# Patient Record
Sex: Male | Born: 1970 | Race: White | Hispanic: No | Marital: Married | Smoking: Never smoker
Health system: Southern US, Community
[De-identification: ages and names within clinical notes are randomized; demographics above are authoritative.]

## PROBLEM LIST (undated history)

## (undated) DIAGNOSIS — T82868A Thrombosis of vascular prosthetic devices, implants and grafts, initial encounter: Secondary | ICD-10-CM

## (undated) DIAGNOSIS — A419 Sepsis, unspecified organism: Secondary | ICD-10-CM

## (undated) HISTORY — PX: TRANSURETHRAL RESECTION OF PROSTATE: SHX73

---

## 2021-05-08 ENCOUNTER — Emergency Department (HOSPITAL_COMMUNITY): Payer: BC Managed Care – PPO

## 2021-05-08 ENCOUNTER — Other Ambulatory Visit: Payer: Self-pay

## 2021-05-08 ENCOUNTER — Encounter (HOSPITAL_COMMUNITY): Payer: Self-pay

## 2021-05-08 ENCOUNTER — Observation Stay (HOSPITAL_COMMUNITY)
Admission: EM | Admit: 2021-05-08 | Discharge: 2021-05-10 | Disposition: A | Discharge status: Home / Self Care | Payer: BC Managed Care – PPO | Attending: Internal Medicine | Admitting: Internal Medicine

## 2021-05-08 DIAGNOSIS — Z20822 Contact with and (suspected) exposure to covid-19: Secondary | ICD-10-CM | POA: Diagnosis not present

## 2021-05-08 DIAGNOSIS — I6509 Occlusion and stenosis of unspecified vertebral artery: Secondary | ICD-10-CM | POA: Diagnosis present

## 2021-05-08 DIAGNOSIS — I471 Supraventricular tachycardia, unspecified: Secondary | ICD-10-CM | POA: Diagnosis present

## 2021-05-08 DIAGNOSIS — I6502 Occlusion and stenosis of left vertebral artery: Principal | ICD-10-CM | POA: Insufficient documentation

## 2021-05-08 DIAGNOSIS — I6330 Cerebral infarction due to thrombosis of unspecified cerebral artery: Secondary | ICD-10-CM | POA: Diagnosis present

## 2021-05-08 DIAGNOSIS — I7774 Dissection of vertebral artery: Secondary | ICD-10-CM

## 2021-05-08 DIAGNOSIS — I651 Occlusion and stenosis of basilar artery: Secondary | ICD-10-CM | POA: Diagnosis not present

## 2021-05-08 DIAGNOSIS — R000 Tachycardia, unspecified: Secondary | ICD-10-CM | POA: Diagnosis present

## 2021-05-08 DIAGNOSIS — R42 Dizziness and giddiness: Secondary | ICD-10-CM | POA: Diagnosis present

## 2021-05-08 LAB — CBC WITH DIFFERENTIAL/PLATELET
Abs Immature Granulocytes: 0.06 10*3/uL (ref 0.00–0.07)
Basophils Absolute: 0 10*3/uL (ref 0.0–0.1)
Basophils Relative: 0 %
Eosinophils Absolute: 0 10*3/uL (ref 0.0–0.5)
Eosinophils Relative: 0 %
HCT: 46.6 % (ref 39.0–52.0)
Hemoglobin: 15.6 g/dL (ref 13.0–17.0)
Immature Granulocytes: 1 %
Lymphocytes Relative: 13 %
Lymphs Abs: 1.4 10*3/uL (ref 0.7–4.0)
MCH: 30.1 pg (ref 26.0–34.0)
MCHC: 33.5 g/dL (ref 30.0–36.0)
MCV: 89.8 fL (ref 80.0–100.0)
Monocytes Absolute: 0.6 10*3/uL (ref 0.1–1.0)
Monocytes Relative: 6 %
Neutro Abs: 9.1 10*3/uL — ABNORMAL HIGH (ref 1.7–7.7)
Neutrophils Relative %: 80 %
Platelets: 261 10*3/uL (ref 150–400)
RBC: 5.19 MIL/uL (ref 4.22–5.81)
RDW: 12.9 % (ref 11.5–15.5)
WBC: 11.3 10*3/uL — ABNORMAL HIGH (ref 4.0–10.5)
nRBC: 0 % (ref 0.0–0.2)

## 2021-05-08 LAB — COMPREHENSIVE METABOLIC PANEL
ALT: 44 U/L (ref 0–44)
AST: 29 U/L (ref 15–41)
Albumin: 4.4 g/dL (ref 3.5–5.0)
Alkaline Phosphatase: 58 U/L (ref 38–126)
Anion gap: 10 (ref 5–15)
BUN: 16 mg/dL (ref 6–20)
CO2: 25 mmol/L (ref 22–32)
Calcium: 9.6 mg/dL (ref 8.9–10.3)
Chloride: 102 mmol/L (ref 98–111)
Creatinine, Ser: 1.05 mg/dL (ref 0.61–1.24)
GFR, Estimated: >60 mL/min (ref >60)
Glucose, Bld: 98 mg/dL (ref 70–99)
Potassium: 3.8 mmol/L (ref 3.5–5.1)
Sodium: 137 mmol/L (ref 135–145)
Total Bilirubin: 0.3 mg/dL (ref 0.3–1.2)
Total Protein: 7.6 g/dL (ref 6.5–8.1)

## 2021-05-08 MED ORDER — TETRACAINE HCL 0.5 % OP SOLN
1.0000 [drp] | Freq: Once | OPHTHALMIC | Status: AC
  Administered 2021-05-08: 1 [drp] via OPHTHALMIC
  Filled 2021-05-08: qty 4

## 2021-05-08 NOTE — ED Provider Notes (Signed)
Emergency Medicine Provider Triage Evaluation Note  Jim Roman , a 50 y.o. male  was evaluated in triage.  Pt complains of blurred vision, R ear tinnitus, and dizziness that started yesterday at 11am. 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 Denies other associated neurologic complaints.   Recent TURP with subsequent sepsis and admission at [facility] in [place]. Was on course of levaquin and since he finished has had about 4 episodes of his typical ocular migraines.   Review of Systems  Positive: Blurred vision, dizziness, tinnitus Negative: Numbness/weakness  Physical Exam  BP (!) 160/101   Pulse 84   Temp 98 F (36.7 C) (Oral)   Resp 16   Ht 5' 5"  (1.651 m)   Wt 77.1 kg   SpO2 98%   BMI 28.29 kg/m  Gen:   Awake, no distress   Resp:  Normal effort  MSK:   Moves extremities without difficulty  Other:  No facial droop, clear speech, moving all extremities  Medical Decision Making  Medically screening exam initiated at 1:43 PM.  Appropriate orders placed.  Gibran Veselka was informed that the remainder of the evaluation will be completed by another provider, this initial triage assessment does not replace that evaluation, and the importance of remaining in the ED until their evaluation is complete.     Rodney Booze, PA-C 05/08/21 1351    Lajean Saver, MD 05/09/21 1254

## 2021-05-08 NOTE — ED Triage Notes (Signed)
Pt reports dizziness and blurred vision in both eyes since yesterday around 11 am. Denies headache Pt was recently admitted for sepsis post TURP surgery.

## 2021-05-08 NOTE — ED Notes (Signed)
Pt denies blurred vision and dizziness at the moment. He feels the blurred vision started after the sepsis dx

## 2021-05-08 NOTE — ED Provider Notes (Signed)
[facility] EMERGENCY DEPARTMENT Provider Note   CSN: [id number] Arrival date & time: 05/08/21  1301     History Chief Complaint  Patient presents with   Dizziness   Blurred Vision    Jim Roman is a 50 y.o. male.   Dizziness  50 year old male PMHx ocular migraines, TURP complicated by postoperative sepsis requiring admission at [facility] in [place], s/p Levaquin x2 weeks that he completed 1 or 2 weeks ago, presenting for dizziness and blurred vision.  Patient reports sudden onset yesterday around 63, affecting only his right eye, constant with intermittent worsening, no modifying factors.  Associated symptoms include dizziness and intermittent dull right-sided headache with ringing nonpulsatile tinnitus, as well as intermittent "burning" smell over the last several days.  He notes that he was diagnosed with ocular migraines roughly 15 to 20 years ago by an ophthalmologist, with episodes causing bilateral significant blurring of vision to the extent where he can only differentiate light and dark objects.  He has had 4 such episodes after completing his course of Levaquin.  His present vision changes are different from his typical ocular migraines.  No prior history of seizure, stroke, brain mass.  No further medical concerns time including fevers, chills, diaphoresis, sore throat, rhinorrhea, cough, shortness of breath, chest pain, palpitations, pedal edema, bowel/bladder changes, focal paresthesias/weakness, eye pain, double vision.  History reviewed. No pertinent past medical history.  There are no problems to display for this patient.   Past Surgical History:  Procedure Laterality Date   TRANSURETHRAL RESECTION OF PROSTATE         No family history on file.     Home Medications Prior to Admission medications   Not on File    Allergies    Sulfa antibiotics  Review of Systems   Review of Systems  Neurological:  Positive for dizziness.   All other systems reviewed and are negative.  Physical Exam Updated Vital Signs BP (!) 163/93 (BP Location: Left Arm)   Pulse 84   Temp 98.3 F (36.8 C) (Oral)   Resp 18   Ht 5' 5"  (1.651 m)   Wt 77.1 kg   SpO2 97%   BMI 28.29 kg/m   Physical Exam Vitals and nursing note reviewed.  Constitutional:      General: He is not in acute distress. HENT:     Head: Normocephalic and atraumatic.     Nose: Nose normal.     Mouth/Throat:     Mouth: Mucous membranes are moist.     Pharynx: Oropharynx is clear.  Eyes:     General: No scleral icterus.       Right eye: No discharge.        Left eye: No discharge.     Extraocular Movements: Extraocular movements intact.     Conjunctiva/sclera: Conjunctivae normal.     Pupils: Pupils are equal, round, and reactive to light.     Comments: OD IOP 17  Cardiovascular:     Rate and Rhythm: Normal rate and regular rhythm.     Heart sounds: Murmur heard.    No friction rub. No gallop.  Pulmonary:     Effort: Pulmonary effort is normal.     Breath sounds: No stridor. No wheezing, rhonchi or rales.  Abdominal:     General: There is no distension.     Palpations: Abdomen is soft.     Tenderness: There is no abdominal tenderness. There is no guarding or rebound.  Musculoskeletal:  Cervical back: Normal range of motion. No rigidity.     Right lower leg: No edema.     Left lower leg: No edema.  Skin:    General: Skin is warm and dry.  Neurological:     Mental Status: He is alert and oriented to person, place, and time. Mental status is at baseline.     Comments: Mental status: a&o x4 Speech: clear, no dysarthria CN II: visual fields grossly intact with blurred vision in right eye CN III/IV/VI: PERRL, EOMI CN V: facial sensation to LT and mastication intact CN VII: no facial droop CN VIII: no nystagmus, audition intact to finger rub VN IX/X: swallow intact CN XI: trapezius and SCM motor function intact CN XII: midline tongue w/o  atrophy or fasciculation RUE: 5/5 strength, sensation to LT intact LUE: 5/5 strength, sensation to LT intact RLE: 5/5 strength, sensation to LT intact LLE: 5/5 strength, sensation to LT intact Coordination: finger-to-nose, foot alternation intact. No pronator drift or dysdiadochokinesia Gait: intact without imbalance   Psychiatric:        Mood and Affect: Mood normal.        Behavior: Behavior normal.    ED Results / Procedures / Treatments   Labs (all labs ordered are listed, but only abnormal results are displayed) Labs Reviewed  CBC WITH DIFFERENTIAL/PLATELET - Abnormal; Notable for the following components:      Result Value   WBC 11.3 (*)    Neutro Abs 9.1 (*)    All other components within normal limits  COMPREHENSIVE METABOLIC PANEL  SEDIMENTATION RATE    EKG EKG Interpretation  Date/Time:  Friday May 08 2021 13:41:44 EDT Ventricular Rate:  94 PR Interval:  152 QRS Duration: 74 QT Interval:  314 QTC Calculation: 392 R Axis:   28 Text Interpretation: Normal sinus rhythm Normal ECG No old tracing to compare Confirmed by Malvin Johns [phone]) on 05/08/2021 11:32:19 PM  Radiology CT Head Wo Contrast  Result Date: 05/08/2021 CLINICAL DATA:  Neuro deficit, acute stroke suspected. Dizziness and blurred vision in both eyes. EXAM: CT HEAD WITHOUT CONTRAST TECHNIQUE: Contiguous axial images were obtained from the base of the skull through the vertex without intravenous contrast. COMPARISON:  None. FINDINGS: Brain: No evidence of acute large vascular territory infarction, hemorrhage, hydrocephalus, extra-axial collection or mass lesion/mass effect. Vascular: No hyperdense vessel identified. Skull: No acute fracture. Sinuses/Orbits: Visualized sinuses are clear. No acute orbital findings. Other: No mastoid effusions. IMPRESSION: No evidence of acute intracranial abnormality. Electronically Signed   By: Margaretha Sheffield MD   On: 05/08/2021 16:29    Procedures Procedures    Medications Ordered in ED Medications  tetracaine (PONTOCAINE) 0.5 % ophthalmic solution 1 drop (1 drop Right Eye Given 05/08/21 2245)    ED Course  I have reviewed the triage vital signs and the nursing notes.  Pertinent labs & imaging results that were available during my care of the patient were reviewed by me and considered in my medical decision making (see chart for details).    MDM Rules/Calculators/A&P                         This is a 50 year old male PMHx postoperative sepsis last month s/p Levaquin x2 weeks after TURP at [facility] in [place], presenting for blurred vision from right eye since yesterday morning with associated intermittent right-sided headaches accompanied by ringing pulsatile tinnitus, and intermittent smell of "something burning."  On exam, he is neuro  intact with normal right intraocular pressure.  All studies independently reviewed by myself, d/w the attending physician, factored into my MDM. -NSR 94 bpm, normal axis, normal intervals, no acute ST/T changes; no prior for comparison -CBCd: WBC 11.3 -Unremarkable: CT head, CMP -Pending: CTA head and neck, and MR brain without  Currently uncertain of the etiology of patient's presentation.  Imaging pending to work-up for vascular injury, stroke, brain mass, neurologic lesion.  Reassuring IOP, doubt glaucoma.  No evidence of iritis.  Unlikely temporal arteritis, no significant pain, not typical demographic.  CT head without evidence of significant midline shift or intracranial bleeding.  Mild leukocytosis, will this is likely stress response.  No evidence of electrolyte derangement on metabolic panel.  No evidence of arrhythmia on EKG.  Discussed with neurology who recommended CTA head and neck and MRI brain without.  Advised that patient is cleared from their standpoint for outpatient neurologic follow-up if imaging is negative.  Plan was discussed with the patient who understands and agrees  Patient  is HDS, nontoxic, ambulatory on reevaluation prior to signout.  Course of care and plan discussed with Dr. Randal Buba, to patient care was transferred.  Final Clinical Impression(s) / ED Diagnoses Final diagnoses:  None    Rx / DC Orders ED Discharge Orders     None        Levin Bacon, MD 05/09/21 8250    Malvin Johns, MD 05/09/21 1110

## 2021-05-09 ENCOUNTER — Encounter (HOSPITAL_COMMUNITY): Payer: Self-pay | Admitting: Emergency Medicine

## 2021-05-09 ENCOUNTER — Observation Stay (HOSPITAL_COMMUNITY): Payer: BC Managed Care – PPO

## 2021-05-09 ENCOUNTER — Observation Stay (HOSPITAL_BASED_OUTPATIENT_CLINIC_OR_DEPARTMENT_OTHER): Payer: BC Managed Care – PPO

## 2021-05-09 ENCOUNTER — Emergency Department (HOSPITAL_COMMUNITY): Payer: BC Managed Care – PPO

## 2021-05-09 DIAGNOSIS — I6502 Occlusion and stenosis of left vertebral artery: Secondary | ICD-10-CM | POA: Diagnosis not present

## 2021-05-09 DIAGNOSIS — I6509 Occlusion and stenosis of unspecified vertebral artery: Secondary | ICD-10-CM | POA: Diagnosis present

## 2021-05-09 DIAGNOSIS — R000 Tachycardia, unspecified: Secondary | ICD-10-CM | POA: Diagnosis present

## 2021-05-09 DIAGNOSIS — I7774 Dissection of vertebral artery: Secondary | ICD-10-CM

## 2021-05-09 DIAGNOSIS — I471 Supraventricular tachycardia: Secondary | ICD-10-CM

## 2021-05-09 DIAGNOSIS — I6330 Cerebral infarction due to thrombosis of unspecified cerebral artery: Secondary | ICD-10-CM | POA: Diagnosis present

## 2021-05-09 LAB — URINALYSIS, ROUTINE W REFLEX MICROSCOPIC
Bacteria, UA: NONE SEEN
Bilirubin Urine: NEGATIVE
Glucose, UA: NEGATIVE mg/dL
Ketones, ur: NEGATIVE mg/dL
Nitrite: NEGATIVE
Protein, ur: NEGATIVE mg/dL
Specific Gravity, Urine: 1.043 — ABNORMAL HIGH (ref 1.005–1.030)
pH: 6 (ref 5.0–8.0)

## 2021-05-09 LAB — BASIC METABOLIC PANEL
Anion gap: 8 (ref 5–15)
BUN: 10 mg/dL (ref 6–20)
CO2: 25 mmol/L (ref 22–32)
Calcium: 9.3 mg/dL (ref 8.9–10.3)
Chloride: 103 mmol/L (ref 98–111)
Creatinine, Ser: 1.09 mg/dL (ref 0.61–1.24)
GFR, Estimated: >60 mL/min (ref >60)
Glucose, Bld: 114 mg/dL — ABNORMAL HIGH (ref 70–99)
Potassium: 4.1 mmol/L (ref 3.5–5.1)
Sodium: 136 mmol/L (ref 135–145)

## 2021-05-09 LAB — CBC
HCT: 44.9 % (ref 39.0–52.0)
Hemoglobin: 15.2 g/dL (ref 13.0–17.0)
MCH: 30.1 pg (ref 26.0–34.0)
MCHC: 33.9 g/dL (ref 30.0–36.0)
MCV: 88.9 fL (ref 80.0–100.0)
Platelets: 234 10*3/uL (ref 150–400)
RBC: 5.05 MIL/uL (ref 4.22–5.81)
RDW: 13.1 % (ref 11.5–15.5)
WBC: 7 10*3/uL (ref 4.0–10.5)
nRBC: 0 % (ref 0.0–0.2)

## 2021-05-09 LAB — ECHOCARDIOGRAM COMPLETE
Area-P 1/2: 4.1 cm2
Height: 65 in
S' Lateral: 2.7 cm
Weight: 2720 oz

## 2021-05-09 LAB — RESP PANEL BY RT-PCR (FLU A&B, COVID) ARPGX2
Influenza A by PCR: NEGATIVE
Influenza B by PCR: NEGATIVE
SARS Coronavirus 2 by RT PCR: NEGATIVE

## 2021-05-09 LAB — HEPARIN LEVEL (UNFRACTIONATED)
Heparin Unfractionated: 0.18 IU/mL — ABNORMAL LOW (ref 0.30–0.70)
Heparin Unfractionated: 0.33 IU/mL (ref 0.30–0.70)

## 2021-05-09 LAB — HEMOGLOBIN A1C
Hgb A1c MFr Bld: 5.8 % — ABNORMAL HIGH (ref 4.8–5.6)
Mean Plasma Glucose: 119.76 mg/dL

## 2021-05-09 LAB — HIV ANTIBODY (ROUTINE TESTING W REFLEX): HIV Screen 4th Generation wRfx: NONREACTIVE

## 2021-05-09 LAB — SEDIMENTATION RATE: Sed Rate: 14 mm/hr (ref 0–16)

## 2021-05-09 MED ORDER — LACTATED RINGERS IV SOLN: INTRAVENOUS | Status: DC

## 2021-05-09 MED ORDER — LACTATED RINGERS IV BOLUS
1000.0000 mL | Freq: Once | INTRAVENOUS | Status: AC
  Administered 2021-05-09: 1000 mL via INTRAVENOUS

## 2021-05-09 MED ORDER — SODIUM CHLORIDE 0.9% FLUSH: 3.0000 mL | Freq: Two times a day (BID) | INTRAVENOUS | Status: DC

## 2021-05-09 MED ORDER — HEPARIN (PORCINE) 25000 UT/250ML-% IV SOLN
1100.0000 [IU]/h | INTRAVENOUS | Status: DC
  Administered 2021-05-09: 1000 [IU]/h via INTRAVENOUS
  Administered 2021-05-10: 1100 [IU]/h via INTRAVENOUS
  Filled 2021-05-09 (×2): qty 250

## 2021-05-09 MED ORDER — ACETAMINOPHEN 650 MG RE SUPP: 650.0000 mg | Freq: Four times a day (QID) | RECTAL | Status: DC | PRN

## 2021-05-09 MED ORDER — METOPROLOL TARTRATE 5 MG/5ML IV SOLN: 5.0000 mg | Freq: Four times a day (QID) | INTRAVENOUS | Status: DC | PRN

## 2021-05-09 MED ORDER — GADOBUTROL 1 MMOL/ML IV SOLN
7.5000 mL | Freq: Once | INTRAVENOUS | Status: AC | PRN
  Administered 2021-05-09: 7.5 mL via INTRAVENOUS

## 2021-05-09 MED ORDER — SODIUM CHLORIDE 0.9 % IV SOLN: 250.0000 mL | INTRAVENOUS | Status: DC | PRN

## 2021-05-09 MED ORDER — ACETAMINOPHEN 325 MG PO TABS
650.0000 mg | ORAL_TABLET | Freq: Four times a day (QID) | ORAL | Status: DC | PRN
  Administered 2021-05-10 (×2): 650 mg via ORAL
  Filled 2021-05-09 (×2): qty 2

## 2021-05-09 MED ORDER — SODIUM CHLORIDE 0.9% FLUSH: 3.0000 mL | INTRAVENOUS | Status: DC | PRN

## 2021-05-09 MED ORDER — IOHEXOL 350 MG/ML SOLN
75.0000 mL | Freq: Once | INTRAVENOUS | Status: AC | PRN
  Administered 2021-05-09: 75 mL via INTRAVENOUS

## 2021-05-09 NOTE — H&P (Signed)
History and Physical    Jim Roman WSF:[id number] DOB: [birth year] DOA: 05/08/2021  PCP: Pcp, No   Patient coming from: Home  Chief Complaint:  Dizziness and visual changes.  HPI: Jim Roman is a 50 y.o. male with medical history significant for BPH, migraines who had a TURP recently that was complicated by postoperative sepsis at [facility] in [facility].  He was treated with antibiotics and completed the course 2 weeks ago.  He is visiting family here in town and for the last 2 days he has had intermittent episodes of dizziness and blurred vision.  He reports that he sometimes has sensation of seeing lights in his visual field and it looks like he is looking at things through water.  He is also been having mild intermittent headaches and occasional ringing in his right ear.  He has not had any injury or trauma to his head.  He reports he occasionally gets pain in the left side of his neck when he is sleeping for the last week but it resolves if he goes and sits in a recliner and sleeps for a little while.  He denies having any chest pain, palpitations, shortness of breath, cough, abdominal pain, dysuria.  Denies fever or chills.  He has not had any chiropractic manipulation of his neck or back.   ED Course: Patient been hemodynamically stable in the emergency room.  His blood pressure has been elevated but he reports he has whitecoat hypertension and when he is at home his blood pressure is normally in the 120-130/75-80 range.  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  Patient been seen by neurology and Dr. Rory Percy recommends further work-up with echocardiogram and MRA of head and neck.  Lab work in the emergency room was unremarkable.  Hospitalist service was asked to serve patient overnight  Review of Systems:  General: Reports dizziness. Denies fever, chills, weight loss, night sweats.  Denies change  in appetite HENT: Reports headaches. Denies head trauma, denies change in hearing, tinnitus. Denies nasal congestion. Denies sore throat, sores in mouth. Denies difficulty swallowing Eyes: Denies blurry vision, pain in eye, drainage.  Denies discoloration of eyes. Neck: Denies pain.  Denies swelling.  Denies pain with movement. Cardiovascular: Denies chest pain, palpitations. Denies edema. Denies orthopnea Respiratory: Denies shortness of breath, cough. Denies wheezing. Denies sputum production Gastrointestinal: Denies abdominal pain, swelling. Denies nausea, vomiting, diarrhea. Denies melena. Denies hematemesis. Musculoskeletal: Denies limitation of movement.  Denies deformity or swelling.  Denies pain.  Denies arthralgias or myalgias. Genitourinary: Denies pelvic pain.  Denies urinary frequency or hesitancy. Denies dysuria.  Skin: Denies rash.  Denies petechiae, purpura, ecchymosis. Neurological: Denies syncope.  Denies seizure activity. Denies weakness or paresthesia. Denies slurred speech, drooping face.  Psychiatric: Denies depression, anxiety. Denies hallucinations.  History reviewed. No pertinent past medical history.  Past Surgical History:  Procedure Laterality Date   TRANSURETHRAL RESECTION OF PROSTATE      Social History  reports that he has never smoked. He has never used smokeless tobacco. He reports previous alcohol use. He reports that he does not use drugs.  Allergies  Allergen Reactions   Sulfa Antibiotics     Made flu symptoms worse    History reviewed. No pertinent family history.   Prior to Admission medications   Not on File    Physical Exam: Vitals:   05/08/21 1456 05/08/21 1738 05/08/21 2023 05/08/21 2242  BP: (!) 143/94 (!) 141/102 (!) 147/102 (!) 163/93  Pulse: 87 86 78 84  Resp: 16 16 20 18   Temp: 98.2 F (36.8 C) 98.1 F (36.7 C) 98 F (36.7 C) 98.3 F (36.8 C)  TempSrc: Oral Oral Oral Oral  SpO2: 99% 100% 98% 97%  Weight:      Height:         Constitutional: NAD, calm, comfortable Vitals:   05/08/21 1456 05/08/21 1738 05/08/21 2023 05/08/21 2242  BP: (!) 143/94 (!) 141/102 (!) 147/102 (!) 163/93  Pulse: 87 86 78 84  Resp: 16 16 20 18   Temp: 98.2 F (36.8 C) 98.1 F (36.7 C) 98 F (36.7 C) 98.3 F (36.8 C)  TempSrc: Oral Oral Oral Oral  SpO2: 99% 100% 98% 97%  Weight:      Height:       General: WDWN, Alert and oriented x3.  Eyes: EOMI, PERRL, conjunctivae normal.  Sclera nonicteric HENT:  [place]/AT, external ears normal.  Nares patent without epistasis.  Mucous membranes are moist. Posterior pharynx clear of any exudate or lesions. Neck: Soft, normal range of motion, supple, no masses,  Trachea midline Respiratory: clear to auscultation bilaterally, no wheezing, no crackles. Normal respiratory effort. No accessory muscle use.  Cardiovascular: Regular rate and rhythm, no murmurs / rubs / gallops. No extremity edema. 2+ pedal pulses. No carotid bruits.  Abdomen: Soft, no tenderness, nondistended, no rebound or guarding.  No masses palpated. Bowel sounds normoactive Musculoskeletal: FROM. no cyanosis. No joint deformity upper and lower extremities. Normal muscle tone.  Skin: Warm, dry, intact no rashes, lesions, ulcers. No induration Neurologic: CN 2-12 grossly intact.  Normal speech.  Sensation intact, patella DTR +1 bilaterally. Strength 5/5 in all extremities. No pronator drift.  Psychiatric: Normal judgment and insight.  Normal mood.    Labs on Admission: I have personally reviewed following labs and imaging studies  CBC: Recent Labs  Lab 05/08/21 1517  WBC 11.3*  NEUTROABS 9.1*  HGB 15.6  HCT 46.6  MCV 89.8  PLT 357    Basic Metabolic Panel: Recent Labs  Lab 05/08/21 1517  NA 137  K 3.8  CL 102  CO2 25  GLUCOSE 98  BUN 16  CREATININE 1.05  CALCIUM 9.6    GFR: Estimated Creatinine Clearance: 82.4 mL/min (by C-G formula based on SCr of 1.05 mg/dL).  Liver Function Tests: Recent Labs  Lab  05/08/21 1517  AST 29  ALT 44  ALKPHOS 58  BILITOT 0.3  PROT 7.6  ALBUMIN 4.4    Urine analysis: No results found for: COLORURINE, APPEARANCEUR, LABSPEC, [place], GLUCOSEU, HGBUR, BILIRUBINUR, KETONESUR, PROTEINUR, UROBILINOGEN, NITRITE, LEUKOCYTESUR  Radiological Exams on Admission: CT ANGIO HEAD NECK W WO CM  Result Date: 05/09/2021 CLINICAL DATA:  Initial evaluation for neuro deficit, stroke suspected, dizziness. EXAM: CT ANGIOGRAPHY HEAD AND NECK TECHNIQUE: Multidetector CT imaging of the head and neck was performed using the standard protocol during bolus administration of intravenous contrast. Multiplanar CT image reconstructions and MIPs were obtained to evaluate the vascular anatomy. Carotid stenosis measurements (when applicable) are obtained utilizing NASCET criteria, using the distal internal carotid diameter as the denominator. CONTRAST:  61m OMNIPAQUE IOHEXOL 350 MG/ML SOLN COMPARISON:  Prior CT from 05/08/2021 and MRI from earlier the same day. FINDINGS: CTA NECK FINDINGS Aortic arch: Visualized aorta normal in caliber with normal branch pattern. No stenosis about the origin of the great vessels. Right carotid system: Right common and internal carotid arteries widely patent without stenosis, dissection  or occlusion. Left carotid system: Left common and internal carotid arteries widely patent without stenosis, dissection or occlusion. Vertebral arteries: Both vertebral arteries arise from the subclavian arteries. No proximal subclavian artery stenosis. There is a subtle filling defect at the origin of the left vertebral artery (series 7, image 279), nonspecific, but suspicious for possible intraluminal thrombus, possibly related to a small focal dissection. This is also seen on coronal sequence (series 8, image 127). No significant stenosis. Vertebral arteries otherwise widely patent distally within the neck without stenosis or other abnormality. Skeleton: Thoracic dextroscoliosis  partially visualized. No discrete or worrisome osseous lesions. Other neck: No other acute soft tissue abnormality within the neck. No mass or adenopathy. 5 mm right thyroid nodule noted, of doubtful significance given size and patient age, no follow-up imaging recommended (ref: J Am Coll Radiol. 2015 Feb;12(2): 143-50). Upper chest: Visualized upper chest demonstrates no acute finding. Review of the MIP images confirms the above findings CTA HEAD FINDINGS Anterior circulation: Both internal carotid arteries widely patent to the termini without stenosis. A1 segments widely patent. Normal anterior communicating artery complex. Both anterior cerebral arteries widely patent to their distal aspects without stenosis. No M1 stenosis or occlusion. Normal MCA bifurcations. Distal MCA branches well perfused and symmetric. Posterior circulation: Both vertebral arteries patent to the vertebrobasilar junction without stenosis. Left vertebral artery slightly dominant. Extradural origin of the left PICA from the distal left V2 segment noted. Both PICA are patent. There is a subtle additional focal 2 mm filling defect within the basilar artery just distal to the vertebrobasilar junction, best seen on coronal sequence (series 8, image 121). Finding also suspicious for a small focus of thrombus, and could be embolic in nature from the left vertebral artery below. Basilar otherwise widely patent to its distal aspect. Superior cerebral arteries patent bilaterally. Both PCAs primarily supplied via the basilar and are well perfused to their distal aspects. No visible downstream occlusion. Venous sinuses: Patent allowing for timing the contrast bolus. Anatomic variants: None significant. Review of the MIP images confirms the above findings IMPRESSION: 1. Subtle filling defect at the origin of the left vertebral artery, nonspecific, but suspicious for intraluminal thrombus, possibly related to a small focal dissection. No significant  stenosis. 2. Additional subtle 2 mm filling defect within the basilar artery just distal to the vertebrobasilar junction, also suspicious for a small focus of thrombus, suspected to be embolic in nature from the left vertebral artery below. Findings could be confirmed with catheter directed arteriogram as clinically warranted. 3. Otherwise negative CTA of the head and neck. No large vessel occlusion. No other hemodynamically significant or correctable stenosis. Critical Value/emergent results were called by telephone at the time of interpretation on 05/09/2021 at 2:32 am to provider Dr. Randal Buba, who verbally acknowledged these results. Electronically Signed   By: Jeannine Boga M.D.   On: 05/09/2021 02:33   CT Head Wo Contrast  Result Date: 05/08/2021 CLINICAL DATA:  Neuro deficit, acute stroke suspected. Dizziness and blurred vision in both eyes. EXAM: CT HEAD WITHOUT CONTRAST TECHNIQUE: Contiguous axial images were obtained from the base of the skull through the vertex without intravenous contrast. COMPARISON:  None. FINDINGS: Brain: No evidence of acute large vascular territory infarction, hemorrhage, hydrocephalus, extra-axial collection or mass lesion/mass effect. Vascular: No hyperdense vessel identified. Skull: No acute fracture. Sinuses/Orbits: Visualized sinuses are clear. No acute orbital findings. Other: No mastoid effusions. IMPRESSION: No evidence of acute intracranial abnormality. Electronically Signed   By: Margaretha Sheffield MD  On: 05/08/2021 16:29   MR BRAIN WO CONTRAST  Result Date: 05/09/2021 CLINICAL DATA:  Initial evaluation for neuro deficit, stroke suspected. EXAM: MRI HEAD WITHOUT CONTRAST TECHNIQUE: Multiplanar, multiecho pulse sequences of the brain and surrounding structures were obtained without intravenous contrast. COMPARISON:  CT from 05/08/2021. FINDINGS: Brain: Cerebral volume within normal limits for age. Scattered subcentimeter foci of T2/FLAIR hyperintensity noted  involving the periventricular and deep white matter both cerebral hemispheres, nonspecific, but most like related chronic microvascular ischemic disease, mild in nature. No abnormal foci of restricted diffusion to suggest acute or subacute ischemia. Gray-white matter differentiation maintained. No encephalomalacia to suggest chronic cortical infarction. No acute intracranial hemorrhage. Single punctate focus of susceptibility artifact noted at the left medulla (series 7, image 18), nonspecific, possibly a small chronic microhemorrhage, of doubtful significance in isolation. No mass lesion, midline shift or mass effect. No hydrocephalus or extra-axial fluid collection. Pituitary gland suprasellar region within normal limits. Midline structures intact and normal. Vascular: Major intracranial vascular flow voids are maintained. Skull and upper cervical spine: Craniocervical junction within normal limits. Bone marrow signal intensity normal. No scalp soft tissue abnormality. Sinuses/Orbits: Globes orbital soft tissues demonstrate no acute finding. Mild mucosal thickening noted within the left maxillary sinus. Paranasal sinuses are otherwise clear. No mastoid effusion. Inner ear structures grossly normal. Other: None. IMPRESSION: 1. No acute intracranial abnormality. 2. Mild cerebral white matter disease, nonspecific, but most likely related to chronic microvascular ischemic disease. Electronically Signed   By: Jeannine Boga M.D.   On: 05/09/2021 01:22    EKG: Independently reviewed.  EKG is reviewed and shows normal sinus rhythm with no acute ST elevation or depression.  QTc 392  Assessment/Plan Principal Problem:   Vertebral artery thrombosis Jim Roman is placed on medical telemetry floor for observation.  He has been evaluated by Neurology who recommends MRA head and neck, Echocardiogram. Stroke team will follow Placed on heparin for therapeutic anticoagulation and will need to be transitioned to  either Coumadin or DOAC per stroke team recommendation.  Cultures and urine culture will be obtained as patient had recent urosepsis. Neurochecks every 4 hours. Check lipid panel    DVT prophylaxis: Placed on Heparin for therapeutic anticoagulation Code Status:   Full code Family Communication:  Diagnosis plan discussed with patient.  Patient verbalized understanding agrees with plan.  Questions answered.  Further recommendations to follow as clinical indicated Disposition Plan:   Patient is from:  Home  Anticipated DC to:  Home  Anticipated DC date:  anticipate less than 2 midnight stay in the hospital   Admission status:  Observation   Yevonne Aline Shantanique Hodo MD [facility]  How to contact the [facility] Attending or Consulting provider [place] or covering provider during after hours [place], for this patient?   Check the care team in [facility] and look for a) attending/consulting [facility] provider listed and b) the [facility] team listed Log into [email or web address] and use [place]'s universal password to access. If you do not have the password, please contact the hospital operator. Locate the [facility] provider you are looking for under [facility] and page to a number that you can be directly reached. If you still have difficulty reaching the provider, please page the [facility] (Director on Call) for the Hospitalists listed on amion for assistance.  05/09/2021, 4:10 AM

## 2021-05-09 NOTE — Progress Notes (Signed)
  Echocardiogram 2D Echocardiogram has been performed.  Jim Roman 05/09/2021, 11:10 AM

## 2021-05-09 NOTE — Progress Notes (Signed)
[place] OF CARE NOTE Patient: Hung Rhinesmith RWP:[id number]   PCP: Pcp, No DOB: [birth year]   DOA: 05/08/2021   DOS: 05/09/2021    Patient was admitted by my colleague earlier on 05/09/2021. I have reviewed the H&P as well as assessment and plan and agree with the same. Important changes in the plan are listed below.  Plan of care: Principal Problem:   Vertebral artery thrombosis Active Problems:   Cerebral thrombosis with cerebral infarction   SVT (supraventricular tachycardia) (HCC)   Sinus tachycardia  Pt has sinus tachycardia for now. On tele earlier he had dizziness with  What appears to be SVT. Echocardiogram currently performed.  Normal EF, no RV dysfunction. Orthostatic vitals are relatively stable. IV Lopressor as needed.  Will bolus with LR.  Continue IVF fluid after that. Monitor on telemetry.  Continue IV heparin.   Author: Berle Mull, MD [facility] 05/09/2021 2:08 PM   If 7PM-7AM, please contact night-coverage at [email or web address]

## 2021-05-09 NOTE — ED Notes (Signed)
Report given to Jyl Heinz, RN

## 2021-05-09 NOTE — ED Provider Notes (Signed)
[facility] EMERGENCY DEPARTMENT Provider Note   CSN: [id number] Arrival date & time: 05/08/21  1301     History Chief Complaint  Patient presents with   Dizziness   Blurred Vision    Jim Roman is a 50 y.o. male.  The history is provided by the patient.  Illness Location:  Eyes, blurry vision that started at home at 11 am on Thursday while at his computer Quality:  Blurry vision Severity:  Moderate Onset quality:  Sudden Duration:  2 days Timing:  Constant Progression:  Unchanged Chronicity:  New Context:  S/p TURP and the post operative sepsis Relieved by:  Nothing Worsened by:  Nothing Ineffective treatments:  None Associated symptoms: no fever, no rash, no vomiting and no wheezing   Associated symptoms comment:  "Feels "off kilter"  Risk factors:  Post op     History reviewed. No pertinent past medical history.  There are no problems to display for this patient.   Past Surgical History:  Procedure Laterality Date   TRANSURETHRAL RESECTION OF PROSTATE         No family history on file.     Home Medications Prior to Admission medications   Not on File    Allergies    Sulfa antibiotics  Review of Systems   Review of Systems  Constitutional:  Negative for fever.  HENT:  Negative for facial swelling.   Eyes:  Positive for visual disturbance.  Respiratory:  Negative for wheezing.   Cardiovascular:  Negative for leg swelling.  Gastrointestinal:  Negative for vomiting.  Musculoskeletal:  Negative for neck stiffness.  Skin:  Negative for rash.  Neurological:  Positive for dizziness. Negative for facial asymmetry.  Psychiatric/Behavioral:  Negative for agitation.    Physical Exam Updated Vital Signs BP (!) 163/93 (BP Location: Left Arm)   Pulse 84   Temp 98.3 F (36.8 C) (Oral)   Resp 18   Ht 5' 5"  (1.651 m)   Wt 77.1 kg   SpO2 97%   BMI 28.29 kg/m   Physical Exam Vitals and nursing note reviewed.  Constitutional:       General: He is not in acute distress.    Appearance: Normal appearance.  HENT:     Head: Normocephalic and atraumatic.     Nose: Nose normal.  Eyes:     Extraocular Movements: Extraocular movements intact.     Conjunctiva/sclera: Conjunctivae normal.  Cardiovascular:     Rate and Rhythm: Normal rate and regular rhythm.     Pulses: Normal pulses.     Heart sounds: Normal heart sounds.  Pulmonary:     Effort: Pulmonary effort is normal.     Breath sounds: Normal breath sounds.  Abdominal:     General: Abdomen is flat. Bowel sounds are normal.     Palpations: Abdomen is soft.     Tenderness: There is no abdominal tenderness. There is no guarding.  Musculoskeletal:        General: Normal range of motion.     Cervical back: Normal range of motion and neck supple.  Skin:    General: Skin is warm and dry.     Capillary Refill: Capillary refill takes less than 2 seconds.  Neurological:     General: No focal deficit present.     Mental Status: He is alert and oriented to person, place, and time.     Deep Tendon Reflexes: Reflexes normal.  Psychiatric:        Mood and Affect:  Mood normal.        Behavior: Behavior normal.    ED Results / Procedures / Treatments   Labs (all labs ordered are listed, but only abnormal results are displayed) Labs Reviewed  CBC WITH DIFFERENTIAL/PLATELET - Abnormal; Notable for the following components:      Result Value   WBC 11.3 (*)    Neutro Abs 9.1 (*)    All other components within normal limits  RESP PANEL BY RT-PCR (FLU A&B, COVID) [id number]  COMPREHENSIVE METABOLIC PANEL  SEDIMENTATION RATE    EKG EKG Interpretation  Date/Time:  Friday May 08 2021 13:41:44 EDT Ventricular Rate:  94 PR Interval:  152 QRS Duration: 74 QT Interval:  314 QTC Calculation: 392 R Axis:   28 Text Interpretation: Normal sinus rhythm Normal ECG No old tracing to compare Confirmed by Malvin Johns [phone]) on 05/08/2021 11:32:19 PM  Radiology CT ANGIO  HEAD NECK W WO CM  Result Date: 05/09/2021 CLINICAL DATA:  Initial evaluation for neuro deficit, stroke suspected, dizziness. EXAM: CT ANGIOGRAPHY HEAD AND NECK TECHNIQUE: Multidetector CT imaging of the head and neck was performed using the standard protocol during bolus administration of intravenous contrast. Multiplanar CT image reconstructions and MIPs were obtained to evaluate the vascular anatomy. Carotid stenosis measurements (when applicable) are obtained utilizing NASCET criteria, using the distal internal carotid diameter as the denominator. CONTRAST:  16m OMNIPAQUE IOHEXOL 350 MG/ML SOLN COMPARISON:  Prior CT from 05/08/2021 and MRI from earlier the same day. FINDINGS: CTA NECK FINDINGS Aortic arch: Visualized aorta normal in caliber with normal branch pattern. No stenosis about the origin of the great vessels. Right carotid system: Right common and internal carotid arteries widely patent without stenosis, dissection or occlusion. Left carotid system: Left common and internal carotid arteries widely patent without stenosis, dissection or occlusion. Vertebral arteries: Both vertebral arteries arise from the subclavian arteries. No proximal subclavian artery stenosis. There is a subtle filling defect at the origin of the left vertebral artery (series 7, image 279), nonspecific, but suspicious for possible intraluminal thrombus, possibly related to a small focal dissection. This is also seen on coronal sequence (series 8, image 127). No significant stenosis. Vertebral arteries otherwise widely patent distally within the neck without stenosis or other abnormality. Skeleton: Thoracic dextroscoliosis partially visualized. No discrete or worrisome osseous lesions. Other neck: No other acute soft tissue abnormality within the neck. No mass or adenopathy. 5 mm right thyroid nodule noted, of doubtful significance given size and patient age, no follow-up imaging recommended (ref: J Am Coll Radiol. 2015 Feb;12(2):  143-50). Upper chest: Visualized upper chest demonstrates no acute finding. Review of the MIP images confirms the above findings CTA HEAD FINDINGS Anterior circulation: Both internal carotid arteries widely patent to the termini without stenosis. A1 segments widely patent. Normal anterior communicating artery complex. Both anterior cerebral arteries widely patent to their distal aspects without stenosis. No M1 stenosis or occlusion. Normal MCA bifurcations. Distal MCA branches well perfused and symmetric. Posterior circulation: Both vertebral arteries patent to the vertebrobasilar junction without stenosis. Left vertebral artery slightly dominant. Extradural origin of the left PICA from the distal left V2 segment noted. Both PICA are patent. There is a subtle additional focal 2 mm filling defect within the basilar artery just distal to the vertebrobasilar junction, best seen on coronal sequence (series 8, image 121). Finding also suspicious for a small focus of thrombus, and could be embolic in nature from the left vertebral artery below. Basilar otherwise widely patent to its  distal aspect. Superior cerebral arteries patent bilaterally. Both PCAs primarily supplied via the basilar and are well perfused to their distal aspects. No visible downstream occlusion. Venous sinuses: Patent allowing for timing the contrast bolus. Anatomic variants: None significant. Review of the MIP images confirms the above findings IMPRESSION: 1. Subtle filling defect at the origin of the left vertebral artery, nonspecific, but suspicious for intraluminal thrombus, possibly related to a small focal dissection. No significant stenosis. 2. Additional subtle 2 mm filling defect within the basilar artery just distal to the vertebrobasilar junction, also suspicious for a small focus of thrombus, suspected to be embolic in nature from the left vertebral artery below. Findings could be confirmed with catheter directed arteriogram as clinically  warranted. 3. Otherwise negative CTA of the head and neck. No large vessel occlusion. No other hemodynamically significant or correctable stenosis. Critical Value/emergent results were called by telephone at the time of interpretation on 05/09/2021 at 2:32 am to provider Dr. Randal Buba, who verbally acknowledged these results. Electronically Signed   By: Jeannine Boga M.D.   On: 05/09/2021 02:33   CT Head Wo Contrast  Result Date: 05/08/2021 CLINICAL DATA:  Neuro deficit, acute stroke suspected. Dizziness and blurred vision in both eyes. EXAM: CT HEAD WITHOUT CONTRAST TECHNIQUE: Contiguous axial images were obtained from the base of the skull through the vertex without intravenous contrast. COMPARISON:  None. FINDINGS: Brain: No evidence of acute large vascular territory infarction, hemorrhage, hydrocephalus, extra-axial collection or mass lesion/mass effect. Vascular: No hyperdense vessel identified. Skull: No acute fracture. Sinuses/Orbits: Visualized sinuses are clear. No acute orbital findings. Other: No mastoid effusions. IMPRESSION: No evidence of acute intracranial abnormality. Electronically Signed   By: Margaretha Sheffield MD   On: 05/08/2021 16:29   MR BRAIN WO CONTRAST  Result Date: 05/09/2021 CLINICAL DATA:  Initial evaluation for neuro deficit, stroke suspected. EXAM: MRI HEAD WITHOUT CONTRAST TECHNIQUE: Multiplanar, multiecho pulse sequences of the brain and surrounding structures were obtained without intravenous contrast. COMPARISON:  CT from 05/08/2021. FINDINGS: Brain: Cerebral volume within normal limits for age. Scattered subcentimeter foci of T2/FLAIR hyperintensity noted involving the periventricular and deep white matter both cerebral hemispheres, nonspecific, but most like related chronic microvascular ischemic disease, mild in nature. No abnormal foci of restricted diffusion to suggest acute or subacute ischemia. Gray-white matter differentiation maintained. No encephalomalacia to  suggest chronic cortical infarction. No acute intracranial hemorrhage. Single punctate focus of susceptibility artifact noted at the left medulla (series 7, image 18), nonspecific, possibly a small chronic microhemorrhage, of doubtful significance in isolation. No mass lesion, midline shift or mass effect. No hydrocephalus or extra-axial fluid collection. Pituitary gland suprasellar region within normal limits. Midline structures intact and normal. Vascular: Major intracranial vascular flow voids are maintained. Skull and upper cervical spine: Craniocervical junction within normal limits. Bone marrow signal intensity normal. No scalp soft tissue abnormality. Sinuses/Orbits: Globes orbital soft tissues demonstrate no acute finding. Mild mucosal thickening noted within the left maxillary sinus. Paranasal sinuses are otherwise clear. No mastoid effusion. Inner ear structures grossly normal. Other: None. IMPRESSION: 1. No acute intracranial abnormality. 2. Mild cerebral white matter disease, nonspecific, but most likely related to chronic microvascular ischemic disease. Electronically Signed   By: Jeannine Boga M.D.   On: 05/09/2021 01:22    Procedures Procedures   Medications Ordered in ED Medications  heparin ADULT infusion 100 units/mL (25000 units/227m) (has no administration in time range)  tetracaine (PONTOCAINE) 0.5 % ophthalmic solution 1 drop (1 drop Right Eye Given 05/08/21  2245)  iohexol (OMNIPAQUE) 350 MG/ML injection 75 mL (75 mLs Intravenous Contrast Given 05/09/21 0136)    ED Course  I have reviewed the triage vital signs and the nursing notes.  Pertinent labs & imaging results that were available during my care of the patient were reviewed by me and considered in my medical decision making (see chart for details).   MDM Reviewed: nursing note and vitals Interpretation: MRI, CT scan and labs Total time providing critical care: 30-74 minutes (heparin drip). This excludes time  spent performing separately reportable procedures and services. Consults: admitting MD and neurology  CRITICAL CARE Performed by: Bevelyn Arriola K Aahana Elza-Rasch Total critical care time: 30 minutes Critical care time was exclusive of separately billable procedures and treating other patients. Critical care was necessary to treat or prevent imminent or life-threatening deterioration. Critical care was time spent personally by me on the following activities: development of treatment plan with patient and/or surrogate as well as nursing, discussions with consultants, evaluation of patient's response to treatment, examination of patient, obtaining history from patient or surrogate, ordering and performing treatments and interventions, ordering and review of laboratory studies, ordering and review of radiographic studies, pulse oximetry and re-evaluation of patient's condition.   Final Clinical Impression(s) / ED Diagnoses Final diagnoses:  Basilar artery embolism  Vertebral artery dissection ([place])   Admit to medicine  Rx / DC Orders ED Discharge Orders     None        Warren Lindahl, MD 05/09/21 0017

## 2021-05-09 NOTE — Progress Notes (Signed)
ANTICOAGULATION CONSULT NOTE - Initial Consult  Pharmacy Consult for Heparin Indication: Vertebral artery thrombosis/dissection, basilar artery thrombosis  Allergies  Allergen Reactions   Sulfa Antibiotics     Made flu symptoms worse    Patient Measurements: Height: 5' 5"  (165.1 cm) Weight: 77.1 kg (170 lb) IBW/kg (Calculated) : 61.5   Vital Signs: BP: 139/96 (07/30 1334) Pulse Rate: 119 (07/30 1334)  Labs: Recent Labs    05/08/21 1517 05/09/21 0922 05/09/21 1409  HGB 15.6 15.2  --   HCT 46.6 44.9  --   PLT 261 234  --   HEPARINUNFRC  --   --  0.18*  CREATININE 1.05 1.09  --      Estimated Creatinine Clearance: 79.4 mL/min (by C-G formula based on SCr of 1.09 mg/dL).   Medical History: History reviewed. No pertinent past medical history.  Assessment: 50 y/o M with dizziness and blurred vision. CT Angio head/neck with possible vertebral and basilar artery thrombosis and possible vertebral artery dissection. Start heparin per pharmacy. PTA meds reviewed.   CBC remains stable. No bleeding per RN. Heparin level returning at 0.18 - subtherapeutic.   Goal of Therapy:  Heparin level 0.3-0.5 units/ml Monitor platelets by anticoagulation protocol: Yes   Plan:  No bolus and increase heparin drip to 1100 units/hr 8 hour heparin level Daily CBC/heparin level Monitor for bleeding  Lorelei Pont, PharmD, BCPS 05/09/2021 2:49 PM ED Clinical Pharmacist -  [phone]

## 2021-05-09 NOTE — ED Notes (Signed)
Patient transported to CT 

## 2021-05-09 NOTE — Progress Notes (Signed)
ANTICOAGULATION CONSULT NOTE - Initial Consult  Pharmacy Consult for Heparin Indication: Vertebral artery thrombosis/dissection, basilar artery thrombosis  Allergies  Allergen Reactions   Sulfa Antibiotics     Made flu symptoms worse    Patient Measurements: Height: 5' 5"  (165.1 cm) Weight: 77.1 kg (170 lb) IBW/kg (Calculated) : 61.5   Vital Signs: Temp: 98.3 F (36.8 C) (07/29 2242) Temp Source: Oral (07/29 2242) BP: 163/93 (07/29 2242) Pulse Rate: 84 (07/29 2242)  Labs: Recent Labs    05/08/21 1517  HGB 15.6  HCT 46.6  PLT 261  CREATININE 1.05    Estimated Creatinine Clearance: 82.4 mL/min (by C-G formula based on SCr of 1.05 mg/dL).   Medical History: History reviewed. No pertinent past medical history.  Assessment: 50 y/o M with dizziness and blurred vision. CT Angio head/neck with possible vertebral and basilar artery thrombosis and possible vertebral artery dissection. Start heparin per pharmacy. CBC/renal function good. PTA meds reviewed.   Goal of Therapy:  Heparin level 0.3-0.5 units/ml Monitor platelets by anticoagulation protocol: Yes   Plan:  No bolus  Start heparin drip at 1000 units/hr 8 hour heparin level Daily CBC/heparin level Monitor for bleeding  Narda Bonds, PharmD, BCPS Clinical Pharmacist Phone: [phone]

## 2021-05-09 NOTE — ED Notes (Signed)
Attempted to give report x2

## 2021-05-09 NOTE — Consult Note (Signed)
Neurology Consultation  Reason for Consult: Dizziness, blurred vision Referring Physician: Dr. April Palombo  CC: Dizziness, blurred vision  History is obtained from: Patient, chart  HPI: Jim Roman is a 50 y.o. male past medical history of ocular migraines, recent recovery from sepsis secondary to urinary retention and UTI after having had TURP followed by postoperative sepsis in [facility], completed course of antibiotics 2 weeks ago, was visiting family here in town, we had since Thursday 11 AM intermittent dizziness and blurred vision.  He reports that he has this intermittent sensation of seeing lights through water.  He also describes a very mild headache on the right temple as well as some ringing in his right ear which is new. Does not have a prior history of strokes. Denies any neck pain.  Denies any recent head or neck trauma. Denies any current fevers or chills.  Denies shortness of breath or cough.  Denies nausea or vomiting. Is symptoms remain persistent since yesterday and that is what brought him to the hospital. I was called earlier on the phone regarding this patient who symptoms had intermittently resolved and given change in character of his migraine as well as sudden onset ringing in his ears, I did recommend MRI of the brain and CT angiography of the head and neck to rule out stroke versus vascular etiology. CTA of the brain was abnormal.  No stroke on the MRI brain-see detailed discussion below.  Patient denies any chiropractic manipulation or head or neck trauma recently.  He had a chiropractic visit in 2019 for some neck manipulation but nothing recent.   LKW: 11 AM 05/08/2019 tpa given?: no, no stroke on imaging Premorbid modified Rankin scale (mRS): 0  ROS: Full ROS was performed and is negative except as noted in the HPI.   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 Family history: No family history of stroke, no family history of premature prostate issues  Social History:   has no history on file for tobacco use, alcohol use, and drug use.  Medications No current facility-administered medications for this encounter. No current outpatient medications on file.  Exam: Current vital signs: BP (!) 163/93 (BP Location: Left Arm)   Pulse 84   Temp 98.3 F (36.8 C) (Oral)   Resp 18   Ht 5' 5"  (1.651 m)   Wt 77.1 kg   SpO2 97%   BMI 28.29 kg/m  Vital signs in last 24 hours: Temp:  [98 F (36.7 C)-98.3 F (36.8 C)] 98.3 F (36.8 C) (07/29 2242) Pulse Rate:  [78-87] 84 (07/29 2242) Resp:  [16-20] 18 (07/29 2242) BP: (141-163)/(93-102) 163/93 (07/29 2242) SpO2:  [97 %-100 %] 97 % (07/29 2242) Weight:  [77.1 kg] 77.1 kg (07/29 1306) GENERAL: Awake, alert in NAD HEENT: - Normocephalic and atraumatic, dry mm, no LN++, no Thyromegally LUNGS - Clear to auscultation bilaterally with no wheezes CV - S1S2 RRR, no m/r/g, equal pulses bilaterally. ABDOMEN - Soft, nontender, nondistended with normoactive BS Ext: warm, well perfused, intact peripheral pulses, no pedal edema  NEURO:  Mental Status: AA&Ox3  Language: speech is nondysarthric.  Naming, repetition, fluency, and comprehension intact. Cranial Nerves: PERRL EOMI, visual fields full, no facial asymmetry, facial sensation intact, hearing intact, tongue/uvula/soft palate midline,  normal sternocleidomastoid and trapezius muscle strength. No evidence of tongue atrophy or fibrillations Motor: 5/5 without drift in all fours Tone: is normal and bulk is normal Sensation- Intact to light touch bilaterally Coordination: FTN intact bilaterally, no ataxia in BLE. Gait- deferred  NIHSS- 0   Labs I have  reviewed labs in epic and the results pertinent to this consultation are:  CBC    Component Value Date/Time   WBC 11.3 (H) 05/08/2021 1517   RBC 5.19 05/08/2021 1517   HGB 15.6 05/08/2021 1517   HCT 46.6 05/08/2021 1517   PLT 261 05/08/2021 1517   MCV 89.8 05/08/2021 1517   MCH 30.1 05/08/2021 1517   MCHC 33.5 05/08/2021 1517   RDW 12.9 05/08/2021 1517   LYMPHSABS 1.4 05/08/2021 1517   MONOABS 0.6 05/08/2021 1517   EOSABS 0.0 05/08/2021 1517   BASOSABS 0.0 05/08/2021 1517    CMP     Component Value Date/Time   NA 137 05/08/2021 1517   K 3.8 05/08/2021 1517   CL 102 05/08/2021 1517   CO2 25 05/08/2021 1517   GLUCOSE 98 05/08/2021 1517   BUN 16 05/08/2021 1517   CREATININE 1.05 05/08/2021 1517   CALCIUM 9.6 05/08/2021 1517   PROT 7.6 05/08/2021 1517   ALBUMIN 4.4 05/08/2021 1517   AST 29 05/08/2021 1517   ALT 44 05/08/2021 1517   ALKPHOS 58 05/08/2021 1517   BILITOT 0.3 05/08/2021 1517   GFRNONAA >60 05/08/2021 1517  ESR 14  Imaging I have reviewed the images obtained:  CT-head-no acute changes MRI brain-no acute changes, no stroke. CT angiography head and neck-abnormal-subtle filling defect at the origin of the left vertebral artery which is nonspecific but suspicious for intraluminal thrombus possibly related to a small focal dissection.  No significant stenosis.  There is an additional subtle 2 mm filling defect within the basilar artery just distal to the VBG also suspicious for a small focus of thrombus suspected to be embolic in nature from the left vertebral artery below.  Otherwise unremarkable.    Assessment:  49 year old with past medical history of ocular migraines, recent sepsis from indwelling catheter post TURP, presenting for dizziness and blurred vision that has been going on now for greater than 24 hours. No stroke on MRI but the CT angiography of the head and neck obtained because of nonspecific and difficult to localize symptoms reveals an  intraluminal thrombus versus dissection in the origin of the left vertebral artery and a small intraluminal thrombus in the basilar artery past the VBG. Given his recent infection, it might be prudent to evaluate him further for any evidence of infective endocarditis although he is not septic or toxic looking and the exam is very reassuring. This could all have happened from a small dissection at the left vertebral artery origin. The dissection does not extend intracranially, and the suspicion for endocarditis although exists, is low, I would recommend anticoagulation over dual antiplatelet at this time due to the intraluminal thrombus and possible embolization of the intraluminal thrombus from the vertebral artery origin up to the vertebrobasilar junction and the basilar artery.  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 -  Blood cultures -Given history of urosepsis, check urinalysis as well -2D echo -Frequent neurochecks -Check A1c and lipid panel -I am not sure if he requires a catheter angiogram at this time but I would let the work-up progress and let the stroke team make that decision. -I would recommend getting a MRA of the head and neck fat-suppressed images- to better evaluate these findings and confirm the findings seen on CT angiography.  Stroke team will follow.  Plan d/w Dr Randal Buba in the ER  -- Amie Portland, MD Neurologist [facility] Pager: [phone]

## 2021-05-09 NOTE — ED Notes (Signed)
Attempted to give reportx1 

## 2021-05-09 NOTE — Plan of Care (Signed)

## 2021-05-09 NOTE — Progress Notes (Signed)
ANTICOAGULATION CONSULT NOTE Pharmacy Consult for Heparin Indication: Vertebral artery thrombosis/dissection, basilar artery thrombosis  Allergies  Allergen Reactions   Sulfa Antibiotics     Made flu symptoms worse    Patient Measurements: Height: 5' 5"  (165.1 cm) Weight: 74.9 kg (165 lb 2 oz) IBW/kg (Calculated) : 61.5   Vital Signs: Temp: 98 F (36.7 C) (07/30 1914) Temp Source: Oral (07/30 1914) BP: 143/103 (07/30 1914) Pulse Rate: 98 (07/30 1914)  Labs: Recent Labs    05/08/21 1517 05/09/21 0922 05/09/21 1409 05/09/21 2226  HGB 15.6 15.2  --   --   HCT 46.6 44.9  --   --   PLT 261 234  --   --   HEPARINUNFRC  --   --  0.18* 0.33  CREATININE 1.05 1.09  --   --      Estimated Creatinine Clearance: 78.4 mL/min (by C-G formula based on SCr of 1.09 mg/dL).  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   Goal of Therapy:  Heparin level 0.3-0.5 units/ml Monitor platelets by anticoagulation protocol: Yes   Plan:  Continue Heparin at current rate   Phillis Knack, PharmD, BCPS

## 2021-05-10 ENCOUNTER — Observation Stay (HOSPITAL_COMMUNITY): Payer: BC Managed Care – PPO

## 2021-05-10 DIAGNOSIS — I7774 Dissection of vertebral artery: Secondary | ICD-10-CM | POA: Diagnosis not present

## 2021-05-10 DIAGNOSIS — I651 Occlusion and stenosis of basilar artery: Secondary | ICD-10-CM

## 2021-05-10 DIAGNOSIS — I6502 Occlusion and stenosis of left vertebral artery: Secondary | ICD-10-CM | POA: Diagnosis not present

## 2021-05-10 LAB — BLOOD CULTURE ID PANEL (REFLEXED) - BCID2

## 2021-05-10 LAB — LIPID PANEL
Cholesterol: 287 mg/dL — ABNORMAL HIGH (ref 0–200)
HDL: 38 mg/dL — ABNORMAL LOW (ref >40)
LDL Cholesterol: 219 mg/dL — ABNORMAL HIGH (ref 0–99)
Total CHOL/HDL Ratio: 7.6 RATIO
Triglycerides: 152 mg/dL — ABNORMAL HIGH (ref <150)
VLDL: 30 mg/dL (ref 0–40)

## 2021-05-10 LAB — CBC
HCT: 40.5 % (ref 39.0–52.0)
Hemoglobin: 13.3 g/dL (ref 13.0–17.0)
MCH: 29.5 pg (ref 26.0–34.0)
MCHC: 32.8 g/dL (ref 30.0–36.0)
MCV: 89.8 fL (ref 80.0–100.0)
Platelets: 207 10*3/uL (ref 150–400)
RBC: 4.51 MIL/uL (ref 4.22–5.81)
RDW: 13.1 % (ref 11.5–15.5)
WBC: 7.8 10*3/uL (ref 4.0–10.5)
nRBC: 0 % (ref 0.0–0.2)

## 2021-05-10 LAB — HEPARIN LEVEL (UNFRACTIONATED): Heparin Unfractionated: 0.33 IU/mL (ref 0.30–0.70)

## 2021-05-10 MED ORDER — ONDANSETRON HCL 4 MG/2ML IJ SOLN
4.0000 mg | Freq: Once | INTRAMUSCULAR | Status: AC
  Administered 2021-05-10: 4 mg via INTRAVENOUS
  Filled 2021-05-10: qty 2

## 2021-05-10 MED ORDER — RIVAROXABAN 20 MG PO TABS: 20.0000 mg | ORAL_TABLET | Freq: Every day | ORAL | 0 refills | Status: AC

## 2021-05-10 MED ORDER — ATORVASTATIN CALCIUM 80 MG PO TABS: 80.0000 mg | ORAL_TABLET | Freq: Every day | ORAL | 0 refills | Status: AC

## 2021-05-10 MED ORDER — ATORVASTATIN CALCIUM 80 MG PO TABS
80.0000 mg | ORAL_TABLET | Freq: Every day | ORAL | Status: DC
  Administered 2021-05-10: 80 mg via ORAL
  Filled 2021-05-10: qty 1

## 2021-05-10 MED ORDER — ONDANSETRON HCL 4 MG/2ML IJ SOLN: 4.0000 mg | Freq: Once | INTRAMUSCULAR | Status: AC

## 2021-05-10 MED ORDER — RIVAROXABAN 20 MG PO TABS
20.0000 mg | ORAL_TABLET | Freq: Every day | ORAL | Status: DC
  Administered 2021-05-10: 20 mg via ORAL
  Filled 2021-05-10: qty 1

## 2021-05-10 NOTE — Progress Notes (Signed)
PHARMACY - PHYSICIAN COMMUNICATION CRITICAL VALUE ALERT - BLOOD CULTURE IDENTIFICATION (BCID)  Scott Vanderveer is an 50 y.o. male who presented to [facility] on 05/08/2021 with a chief complaint of dizziness, vertebral artery thrombosis  Assessment:  1/2 blood cultures growing methicillin resistant Staph epidermidis, likely contaminant.  Pt afebrile, WBC 7.8   Name of physician (or Provider) Contacted:  Dr. Hal Hope  Current antibiotics: None  Changes to prescribed antibiotics recommended:  No changes at this time Results for orders placed or performed during the hospital encounter of 05/08/21  Blood Culture ID Panel (Reflexed) (Collected: 05/09/2021  5:30 AM)  Result Value Ref Range   Enterococcus faecalis NOT DETECTED NOT DETECTED   Enterococcus Faecium NOT DETECTED NOT DETECTED   Listeria monocytogenes NOT DETECTED NOT DETECTED   Staphylococcus species DETECTED (A) NOT DETECTED   Staphylococcus aureus (BCID) NOT DETECTED NOT DETECTED   Staphylococcus epidermidis DETECTED (A) NOT DETECTED   Staphylococcus lugdunensis NOT DETECTED NOT DETECTED   Streptococcus species NOT DETECTED NOT DETECTED   Streptococcus agalactiae NOT DETECTED NOT DETECTED   Streptococcus pneumoniae NOT DETECTED NOT DETECTED   Streptococcus pyogenes NOT DETECTED NOT DETECTED   A.calcoaceticus-baumannii NOT DETECTED NOT DETECTED   Bacteroides fragilis NOT DETECTED NOT DETECTED   Enterobacterales NOT DETECTED NOT DETECTED   Enterobacter cloacae complex NOT DETECTED NOT DETECTED   Escherichia coli NOT DETECTED NOT DETECTED   Klebsiella aerogenes NOT DETECTED NOT DETECTED   Klebsiella oxytoca NOT DETECTED NOT DETECTED   Klebsiella pneumoniae NOT DETECTED NOT DETECTED   Proteus species NOT DETECTED NOT DETECTED   Salmonella species NOT DETECTED NOT DETECTED   Serratia marcescens NOT DETECTED NOT DETECTED   Haemophilus influenzae NOT DETECTED NOT DETECTED   Neisseria meningitidis NOT DETECTED NOT DETECTED    Pseudomonas aeruginosa NOT DETECTED NOT DETECTED   Stenotrophomonas maltophilia NOT DETECTED NOT DETECTED   Candida albicans NOT DETECTED NOT DETECTED   Candida auris NOT DETECTED NOT DETECTED   Candida glabrata NOT DETECTED NOT DETECTED   Candida krusei NOT DETECTED NOT DETECTED   Candida parapsilosis NOT DETECTED NOT DETECTED   Candida tropicalis NOT DETECTED NOT DETECTED   Cryptococcus neoformans/gattii NOT DETECTED NOT DETECTED   Methicillin resistance mecA/C DETECTED (A) NOT DETECTED    Caryl Pina 05/10/2021  4:13 AM

## 2021-05-10 NOTE — Progress Notes (Signed)
   05/10/21 0225  Provider Notification  Provider Name/Title Dr Hal Hope  Date Provider Notified 05/10/21  Time Provider Notified 0225  Notification Type Page  Notification Reason Other (Comment) (pt c/o of severe headache)  Provider response See new orders  Date of Provider Response 05/10/21  Time of Provider Response [phone]

## 2021-05-10 NOTE — Progress Notes (Addendum)
STROKE TEAM PROGRESS NOTE   INTERVAL HISTORY Pt is doing well, still with some mild blurred vision. MRI was neg for stroke. He has insurance coverage and will start on Xarelto with lunch now. Instructed RN to turn off Heparin gtt in 2mn after ingestion of Xarelto. Educated pt on s/s of stroke and importance of taking Xarelto on time every day. He will start taking with breakfast tomorrow at home.   Vitals:   05/10/21 0008 05/10/21 0218 05/10/21 0446 05/10/21 0757  BP: (!) 146/93 (!) 133/97 (!) 137/92 (!) 131/91  Pulse: 83 61 82 83  Resp: 20  17 14   Temp: 98.6 F (37 C) 97.7 F (36.5 C) 97.7 F (36.5 C) 98 F (36.7 C)  TempSrc: Oral Oral Oral Oral  SpO2: 96% 98% 98% 97%  Weight:      Height:       CBC:  Recent Labs  Lab 05/08/21 1517 05/09/21 0922 05/10/21 0126  WBC 11.3* 7.0 7.8  NEUTROABS 9.1*  --   --   HGB 15.6 15.2 13.3  HCT 46.6 44.9 40.5  MCV 89.8 88.9 89.8  PLT 261 234 2229  Basic Metabolic Panel:  Recent Labs  Lab 05/08/21 1517 05/09/21 0922  NA 137 136  K 3.8 4.1  CL 102 103  CO2 25 25  GLUCOSE 98 114*  BUN 16 10  CREATININE 1.05 1.09  CALCIUM 9.6 9.3   Lipid Panel:  Recent Labs  Lab 05/09/21 0922  CHOL 287*  TRIG 152*  HDL 38*  CHOLHDL 7.6  VLDL 30  LDLCALC 219*   HgbA1c:  Recent Labs  Lab 05/09/21 0515  HGBA1C 5.8*   Urine Drug Screen: No results for input(s): LABOPIA, COCAINSCRNUR, LABBENZ, AMPHETMU, THCU, LABBARB in the last 168 hours.  Alcohol Level No results for input(s): ETH in the last 168 hours.  IMAGING past 24 hours CT HEAD WO CONTRAST  Result Date: 05/10/2021 CLINICAL DATA:  Headache, intracranial hemorrhage EXAM: CT HEAD WITHOUT CONTRAST TECHNIQUE: Contiguous axial images were obtained from the base of the skull through the vertex without intravenous contrast. COMPARISON:  05/08/2021 FINDINGS: Brain: Normal anatomic configuration. No abnormal intra or extra-axial mass lesion or fluid collection. No abnormal mass effect or  midline shift. No evidence of acute intracranial hemorrhage or infarct. Ventricular size is normal. Cerebellum unremarkable. Vascular: Unremarkable Skull: Intact Sinuses/Orbits: Mild mucosal thickening within the left maxillary sinus without air-fluid level identified. Sinuses are otherwise clear. Orbits are unremarkable. Other: Mastoid air cells and middle ear cavities are clear. IMPRESSION: No acute intracranial abnormality. Mild left maxillary sinus disease. Electronically Signed   By: AFidela SalisburyMD   On: 05/10/2021 03:41    PHYSICAL EXAM General: Appears well-developed; no distress. Psych: Affect appropriate to situation Eyes: No scleral injection HENT: No OP obstrucion Head: Normocephalic.  Cardiovascular: Normal rate and regular rhythm.  Respiratory: Effort normal and breath sounds normal to anterior ascultation GI: Soft.  No distension. There is no tenderness.  Skin: WDI    Neurological Examination Mental Status: Alert, oriented, thought content appropriate.  Speech fluent without evidence of aphasia. Able to follow 3 step commands without difficulty. Cranial Nerves: II: Visual fields grossly normal, c/o blurred vision, no nystagmus or diplopia III,IV, VI: ptosis not present, extra-ocular motions intact bilaterally, pupils equal, round, reactive to light and accommodation V,VII: smile symmetric, facial light touch sensation normal bilaterally VIII: hearing normal bilaterally IX,X: uvula rises symmetrically XI: bilateral shoulder shrug XII: midline tongue extension Motor: Right : Upper  extremity   5/5    Left:     Upper extremity   5/5  Lower extremity   5/5     Lower extremity   5/5 Tone and bulk:normal tone throughout; no atrophy noted Sensory: Pinprick and light touch intact throughout, bilaterally Deep Tendon Reflexes: 2+ and symmetric throughout Plantars: Right: downgoing   Left: downgoing Cerebellar: normal finger-to-nose, normal rapid alternating movements and  normal heel-to-shin test Gait: normal gait and station   ASSESSMENT/PLAN Mr. Jerian Morais is a 50 y.o. male with history of ocular migraines, recent recovery from sepsis secondary to urinary retention and UTI after having had TURP followed by postoperative sepsis in [facility], completed course of antibiotics 2 weeks ago. He is visiting family in town, when had intermittent dizziness and blurred vision.  He reports that he has this intermittent sensation of seeing lights through water.  He also describes a very mild headache on the right temple as well as some ringing in his right ear which is new.   No Stroke CTA head & neck subtle filling defect at the origin of the left vertebral artery which is nonspecific but suspicious for intraluminal thrombus possibly related to a small focal dissection.  No significant stenosis.  There is an additional subtle 2 mm filling defect within the basilar artery just distal to the VBG also suspicious for a small focus of thrombus suspected to be embolic in nature from the left vertebral artery below. MRI  No acute finding MRA  Lt vert is wnl. Subtle defect in vertebrobasilar junction. Possible fenestration or vascular web, cannot r/o thrombus. Tortuous ICAs and BA noted 2D Echo 88% EF, Gr 1 diastolic dysfunction. LDL 219 HgbA1c 5.8 VTE prophylaxis - On heparin gtt    Diet   Diet Heart Room service appropriate? Yes; Fluid consistency: Thin   none  prior to admission, now on Xarelto (rivaroxaban) daily for 3 to 6 months. Will need out pt f/u imaging and f/u with Neurology prior to stopping Therapy recommendations:  home; no rehab needs Disposition:  Home today from neuro stand point. Did recommend not driving back to [facility] for a few days to ensure proper rest.  No dx of Hypertension Home meds:  none Stable Permissive hypertension (OK if < 220/120) but gradually normalize in 5-7 days Long-term BP goal normotensive  Hyperlipidemia Home  meds:  none LDL 219, goal < 70 Add Lipitor 4m High intensity statin  Continue statin at discharge  Diabetes type II no dx HgbA1c 5.8, goal < 7.0 CBGs No results for input(s): GLUCAP in the last 72 hours.  SSI  Other Stroke Risk Factors None, however it is suspicious that his recent septic shock may have played a role in a pro-thrombic state.  Other [facility] and instructed pt to make appts at both so no matter which place he is in he can choose where to have f/u. We will s/o at this time. Please call back if needed.  Desiree Metzger-Cihelka, ARNP-C, ANVP-BC Pager: [phone]   ATTENDING ATTESTATION:  Dr. PReeves Forthevaluated pt independently, reviewed imaging, chart, labs. Discussed and formulated plan. Please see NP note above for details.  Arrielle Mcginn,MD  To contact Stroke Continuity provider, please refer to [email or web address] After hours, contact General Neurology

## 2021-05-10 NOTE — Plan of Care (Signed)
On-call cross coverage note  Patient complained of worsening headache.  Given that he is on anticoagulation, repeat CT head ordered by the primary team.  No acute changes on repeat CT head. Stroke team to follow in the morning  -- Amie Portland, MD Neurologist [facility] Pager: [phone]

## 2021-05-10 NOTE — Progress Notes (Signed)
Pt has been discharged from the unit via wheelchair. All belongings and equipment sent with pt. Pt sent in good spirits.

## 2021-05-10 NOTE — Discharge Summary (Signed)
[facility] Discharge Summary   Patient: Jim Roman OIZ:[id number]  PCP: Pcp, No  Date of admission: 05/08/2021   Date of discharge:  05/10/2021     Discharge Diagnoses:  Principal Problem:   Vertebral artery thrombosis Active Problems:   Cerebral thrombosis with cerebral infarction   SVT (supraventricular tachycardia) (HCC)   Sinus tachycardia  Admitted From: home Disposition:  Home   Recommendations for Outpatient Follow-up:  PCP: follow up with PCP in 1 week   Follow-up Information     Metzger-Cihelka, Desiree, NP Follow up.   Specialty: Neurology Contact information: [address] [place] [address] [facility]. Schedule an appointment as soon as possible for a visit in 3 month(s).   Contact information: [place], [place]               Discharge Instructions     Diet - low sodium heart healthy   Complete by: As directed    Increase activity slowly   Complete by: As directed        Diet recommendation: Cardiac diet  Activity: The patient is advised to gradually reintroduce usual activities, as tolerated  Discharge Condition: stable  Code Status: Full code   History of present illness: As per the H and P dictated on admission, " Jim Roman is a 50 y.o. male with medical history significant for BPH, migraines who had a TURP recently that was complicated by postoperative sepsis at [facility] in [place].  He was treated with antibiotics and completed the course 2 weeks ago.  He is visiting family here in town and for the last 2 days he has had intermittent episodes of dizziness and blurred vision.  He reports that he sometimes has sensation of seeing lights in his visual field and it looks like he is looking at things through water.  He is also been having mild intermittent headaches and occasional ringing in his right ear.  He has not had any injury or trauma  to his head.  He reports he occasionally gets pain in the left side of his neck when he is sleeping for the last week but it resolves if he goes and sits in a recliner and sleeps for a little while.  He denies having any chest pain, palpitations, shortness of breath, cough, abdominal pain, dysuria.  Denies fever or chills.  He has not had any chiropractic manipulation of his neck or back.   ED Course: Patient been hemodynamically stable in the emergency room.  His blood pressure has been elevated but he reports he has whitecoat hypertension and when he is at home his blood pressure is normally in the 120-130/75-80 range.  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  Patient been seen by neurology and Dr. Rory Percy recommends further work-up with echocardiogram and MRA of head and neck.  Lab work in the emergency room was unremarkable.  Hospitalist service was asked to serve patient overnight"  Hospital Course:  Summary of his active problems in the hospital is as following.  Vertebral artery thrombosis CTA head & neck subtle filling defect at the origin of the left vertebral artery which is nonspecific but suspicious for intraluminal thrombus possibly related to a small focal dissection.  No significant stenosis.  There is an additional subtle 2 mm filling defect within the basilar artery  just distal to the VBG also suspicious for a small focus of thrombus suspected to be embolic in nature from the left vertebral artery below. MRI  No acute finding Cannot r/o thrombus. Tortuous ICAs and BA noted Started on IV heparin. Transition to oral Xarelto on discharge. No loading recommended by Neurology   SVT. Sinus tachycardia. Pt has sinus tachycardia for now.  On tele earlier he had dizziness with What appears to be SVT. Echocardiogram currently performed.  Normal EF, no RV dysfunction. Orthostatic vitals are relatively stable. Patient  was given IV fluid.  No significant event on monitor on telemetry.  Recent BPH treatment with TURP. Sepsis after procedure. Treated with Levaquin. Thrombosis could be associated with that event.  Patient was ambulatory without any assistance. On the day of the discharge the patient's vitals were stable, and no other new acute medical condition were reported. The patient was felt safe to be discharge at Home with no therapy needed on discharge.  Consultants: Neurology  Procedures: Echocardiogram   DISCHARGE MEDICATION: Allergies as of 05/10/2021       Reactions   Sulfa Antibiotics    Made flu symptoms worse        Medication List     STOP taking these medications    ibuprofen 200 MG tablet Commonly known as: ADVIL       TAKE these medications    acetaminophen 500 MG tablet Commonly known as: TYLENOL Take 500 mg by mouth every 6 (six) hours as needed for mild pain or headache.   atorvastatin 80 MG tablet Commonly known as: LIPITOR Take 1 tablet (80 mg total) by mouth daily. Start taking on: May 11, 2021   CLEAR EYES MAX REDNESS RELIEF OP Apply 1 drop to eye daily as needed (irritated eyes).   PROBIOTIC PO Take 1 capsule by mouth daily.   rivaroxaban 20 MG Tabs tablet Commonly known as: XARELTO Take 1 tablet (20 mg total) by mouth daily. Start taking on: May 11, 2021        Discharge Exam: Danley Danker Weights   05/08/21 1306 05/09/21 1732  Weight: 77.1 kg 74.9 kg   Vitals:   05/10/21 1149 05/10/21 1555  BP: (!) 146/82 (!) 145/89  Pulse: 86 67  Resp: 14 14  Temp: 98.2 F (36.8 C) 97.8 F (36.6 C)  SpO2: 99% 100%   General: Appear in mild distress, no Rash; Oral Mucosa Clear, moist. no Abnormal Neck Mass Or lumps, Conjunctiva normal  Cardiovascular: S1 and S2 Present, no Murmur, Respiratory: good respiratory effort, Bilateral Air entry present and CTA, no Crackles, no wheezes Abdomen: Bowel Sound present, Soft and no tenderness Extremities: no  Pedal edema Neurology: alert and oriented to time, place, and person affect appropriate. no new focal deficit Gait not checked due to patient safety concerns    The results of significant diagnostics from this hospitalization (including imaging, microbiology, ancillary and laboratory) are listed below for reference.    Significant Diagnostic Studies: CT ANGIO HEAD NECK W WO CM  Result Date: 05/09/2021 CLINICAL DATA:  Initial evaluation for neuro deficit, stroke suspected, dizziness. EXAM: CT ANGIOGRAPHY HEAD AND NECK TECHNIQUE: Multidetector CT imaging of the head and neck was performed using the standard protocol during bolus administration of intravenous contrast. Multiplanar CT image reconstructions and MIPs were obtained to evaluate the vascular anatomy. Carotid stenosis measurements (when applicable) are obtained utilizing NASCET criteria, using the distal internal carotid diameter as the denominator. CONTRAST:  7m OMNIPAQUE IOHEXOL 350 MG/ML SOLN COMPARISON:  Prior CT from 05/08/2021 and MRI from earlier the same day. FINDINGS: CTA NECK FINDINGS Aortic arch: Visualized aorta normal in caliber with normal branch pattern. No stenosis about the origin of the great vessels. Right carotid system: Right common and internal carotid arteries widely patent without stenosis, dissection or occlusion. Left carotid system: Left common and internal carotid arteries widely patent without stenosis, dissection or occlusion. Vertebral arteries: Both vertebral arteries arise from the subclavian arteries. No proximal subclavian artery stenosis. There is a subtle filling defect at the origin of the left vertebral artery (series 7, image 279), nonspecific, but suspicious for possible intraluminal thrombus, possibly related to a small focal dissection. This is also seen on coronal sequence (series 8, image 127). No significant stenosis. Vertebral arteries otherwise widely patent distally within the neck without stenosis  or other abnormality. Skeleton: Thoracic dextroscoliosis partially visualized. No discrete or worrisome osseous lesions. Other neck: No other acute soft tissue abnormality within the neck. No mass or adenopathy. 5 mm right thyroid nodule noted, of doubtful significance given size and patient age, no follow-up imaging recommended (ref: J Am Coll Radiol. 2015 Feb;12(2): 143-50). Upper chest: Visualized upper chest demonstrates no acute finding. Review of the MIP images confirms the above findings CTA HEAD FINDINGS Anterior circulation: Both internal carotid arteries widely patent to the termini without stenosis. A1 segments widely patent. Normal anterior communicating artery complex. Both anterior cerebral arteries widely patent to their distal aspects without stenosis. No M1 stenosis or occlusion. Normal MCA bifurcations. Distal MCA branches well perfused and symmetric. Posterior circulation: Both vertebral arteries patent to the vertebrobasilar junction without stenosis. Left vertebral artery slightly dominant. Extradural origin of the left PICA from the distal left V2 segment noted. Both PICA are patent. There is a subtle additional focal 2 mm filling defect within the basilar artery just distal to the vertebrobasilar junction, best seen on coronal sequence (series 8, image 121). Finding also suspicious for a small focus of thrombus, and could be embolic in nature from the left vertebral artery below. Basilar otherwise widely patent to its distal aspect. Superior cerebral arteries patent bilaterally. Both PCAs primarily supplied via the basilar and are well perfused to their distal aspects. No visible downstream occlusion. Venous sinuses: Patent allowing for timing the contrast bolus. Anatomic variants: None significant. Review of the MIP images confirms the above findings IMPRESSION: 1. Subtle filling defect at the origin of the left vertebral artery, nonspecific, but suspicious for intraluminal thrombus, possibly  related to a small focal dissection. No significant stenosis. 2. Additional subtle 2 mm filling defect within the basilar artery just distal to the vertebrobasilar junction, also suspicious for a small focus of thrombus, suspected to be embolic in nature from the left vertebral artery below. Findings could be confirmed with catheter directed arteriogram as clinically warranted. 3. Otherwise negative CTA of the head and neck. No large vessel occlusion. No other hemodynamically significant or correctable stenosis. Critical Value/emergent results were called by telephone at the time of interpretation on 05/09/2021 at 2:32 am to provider Dr. Randal Buba, who verbally acknowledged these results. Electronically Signed   By: Jeannine Boga M.D.   On: 05/09/2021 02:33   CT HEAD WO CONTRAST  Result Date: 05/10/2021 CLINICAL DATA:  Headache, intracranial hemorrhage EXAM: CT HEAD WITHOUT CONTRAST TECHNIQUE: Contiguous axial images were obtained from the base of the skull through the vertex without intravenous contrast. COMPARISON:  05/08/2021 FINDINGS: Brain: Normal anatomic configuration. No abnormal intra or extra-axial mass lesion or fluid collection. No abnormal  mass effect or midline shift. No evidence of acute intracranial hemorrhage or infarct. Ventricular size is normal. Cerebellum unremarkable. Vascular: Unremarkable Skull: Intact Sinuses/Orbits: Mild mucosal thickening within the left maxillary sinus without air-fluid level identified. Sinuses are otherwise clear. Orbits are unremarkable. Other: Mastoid air cells and middle ear cavities are clear. IMPRESSION: No acute intracranial abnormality. Mild left maxillary sinus disease. Electronically Signed   By: Fidela Salisbury MD   On: 05/10/2021 03:41   CT Head Wo Contrast  Result Date: 05/08/2021 CLINICAL DATA:  Neuro deficit, acute stroke suspected. Dizziness and blurred vision in both eyes. EXAM: CT HEAD WITHOUT CONTRAST TECHNIQUE: Contiguous axial images were  obtained from the base of the skull through the vertex without intravenous contrast. COMPARISON:  None. FINDINGS: Brain: No evidence of acute large vascular territory infarction, hemorrhage, hydrocephalus, extra-axial collection or mass lesion/mass effect. Vascular: No hyperdense vessel identified. Skull: No acute fracture. Sinuses/Orbits: Visualized sinuses are clear. No acute orbital findings. Other: No mastoid effusions. IMPRESSION: No evidence of acute intracranial abnormality. Electronically Signed   By: Margaretha Sheffield MD   On: 05/08/2021 16:29   MR ANGIO HEAD WO CONTRAST  Result Date: 05/09/2021 CLINICAL DATA:  50 year old male with neurologic deficit. Unrevealing MRI. CTA neck raising the possibility of focal dissection at the left vertebral artery origin, subtle irregularity of the proximal basilar artery. EXAM: MRA HEAD WITHOUT CONTRAST MRA NECK WITHOUT AND WITH CONTRAST TECHNIQUE: Multiplanar, multiecho pulse sequences of the brain and surrounding structures were obtained without and with intravenous contrast. Angiographic images of the Circle of Willis were obtained using MRA technique without intravenous contrast. Angiographic images of the neck were obtained using MRA technique without and with intravenous contrast. Carotid stenosis measurements (when applicable) are obtained utilizing NASCET criteria, using the distal internal carotid diameter as the denominator. CONTRAST:  7.35m GADAVIST GADOBUTROL 1 MMOL/ML IV SOLN COMPARISON:  CTA head and neck 0127 hours today. Brain MRI 0014 hours today. FINDINGS: MRA NECK FINDINGS Precontrast time-of-flight images demonstrate antegrade flow in the bilateral cervical carotid and vertebral arteries. Carotid bifurcations appear within normal limits. The left vertebral artery appears slightly dominant. Post-contrast neck MRA images demonstrate a mildly bovine arch configuration. Proximal great vessels appear normal. Right CCA, right carotid bifurcation and  proximal cervical right ICA are normal. Highly tortuous but otherwise negative distal cervical right ICA. Grossly negative visible right anterior circulation. Bovine left CCA origin. Left CCA, left carotid bifurcation, and cervical left ICA are within normal limits. Grossly negative visible left anterior circulation. Both proximal subclavian arteries appear normal. Both vertebral artery origins are patent. No irregularity at the left vertebral artery origin by MRA. The left vertebral appears mildly dominant throughout the neck. No vertebral artery stenosis or lesion identified to the skull base. MRA HEAD FINDINGS No intracranial mass effect or ventriculomegaly. Antegrade flow in the posterior circulation. The distal vertebral arteries are within normal limits, the left is mildly dominant. Normal PICA origins. At the vertebrobasilar junction small ventral defect redemonstrated. On coronal MIP images (series 5, image 55 and series 1050, image 11) this more resembles a fenestration (normal variant), or perhaps a tiny vascular web. No associated stenosis. Tortuous but otherwise normal basilar artery to the tip. Patent SCA and PCA origins. Bilateral PCA branches are normal aside from tortuosity. Antegrade flow in both ICA siphons with tortuous ICAs below the skull base. No siphon stenosis. Ophthalmic artery origins appear normal. Posterior communicating arteries are diminutive or absent. Patent carotid termini, MCA and ACA origins. Diminutive or absent  anterior communicating artery. Small right A2 segment infundibulum of the right frontopolar branch on series 5, image 115 (normal variant). Visible ACA branches are within normal limits. Left MCA M1 segment, trifurcation and visible left MCA branches are within normal limits. The right MCA M1 appears "duplicated" with a bifurcation at the carotid terminus, non-equal M1 segments. Visible right MCA branches are within normal limits. IMPRESSION: 1. The left vertebral artery  appears normal by MRA. But note that CTA is more sensitive for dissection. 2. A subtle defect at the ventral vertebrobasilar junction is re-demonstrated by MRA, although seems more likely to be a small fenestration or perhaps vascular web than a thrombus or other vessel pathology. 3. Otherwise negative MRA head and neck; tortuous ICAs and Basilar artery. Electronically Signed   By: Genevie Ann M.D.   On: 05/09/2021 10:19   MR ANGIO NECK W WO CONTRAST  Result Date: 05/09/2021 CLINICAL DATA:  50 year old male with neurologic deficit. Unrevealing MRI. CTA neck raising the possibility of focal dissection at the left vertebral artery origin, subtle irregularity of the proximal basilar artery. EXAM: MRA HEAD WITHOUT CONTRAST MRA NECK WITHOUT AND WITH CONTRAST TECHNIQUE: Multiplanar, multiecho pulse sequences of the brain and surrounding structures were obtained without and with intravenous contrast. Angiographic images of the Circle of Willis were obtained using MRA technique without intravenous contrast. Angiographic images of the neck were obtained using MRA technique without and with intravenous contrast. Carotid stenosis measurements (when applicable) are obtained utilizing NASCET criteria, using the distal internal carotid diameter as the denominator. CONTRAST:  7.3m GADAVIST GADOBUTROL 1 MMOL/ML IV SOLN COMPARISON:  CTA head and neck 0127 hours today. Brain MRI 0014 hours today. FINDINGS: MRA NECK FINDINGS Precontrast time-of-flight images demonstrate antegrade flow in the bilateral cervical carotid and vertebral arteries. Carotid bifurcations appear within normal limits. The left vertebral artery appears slightly dominant. Post-contrast neck MRA images demonstrate a mildly bovine arch configuration. Proximal great vessels appear normal. Right CCA, right carotid bifurcation and proximal cervical right ICA are normal. Highly tortuous but otherwise negative distal cervical right ICA. Grossly negative visible right  anterior circulation. Bovine left CCA origin. Left CCA, left carotid bifurcation, and cervical left ICA are within normal limits. Grossly negative visible left anterior circulation. Both proximal subclavian arteries appear normal. Both vertebral artery origins are patent. No irregularity at the left vertebral artery origin by MRA. The left vertebral appears mildly dominant throughout the neck. No vertebral artery stenosis or lesion identified to the skull base. MRA HEAD FINDINGS No intracranial mass effect or ventriculomegaly. Antegrade flow in the posterior circulation. The distal vertebral arteries are within normal limits, the left is mildly dominant. Normal PICA origins. At the vertebrobasilar junction small ventral defect redemonstrated. On coronal MIP images (series 5, image 55 and series 1050, image 11) this more resembles a fenestration (normal variant), or perhaps a tiny vascular web. No associated stenosis. Tortuous but otherwise normal basilar artery to the tip. Patent SCA and PCA origins. Bilateral PCA branches are normal aside from tortuosity. Antegrade flow in both ICA siphons with tortuous ICAs below the skull base. No siphon stenosis. Ophthalmic artery origins appear normal. Posterior communicating arteries are diminutive or absent. Patent carotid termini, MCA and ACA origins. Diminutive or absent anterior communicating artery. Small right A2 segment infundibulum of the right frontopolar branch on series 5, image 115 (normal variant). Visible ACA branches are within normal limits. Left MCA M1 segment, trifurcation and visible left MCA branches are within normal limits. The right MCA M1  appears "duplicated" with a bifurcation at the carotid terminus, non-equal M1 segments. Visible right MCA branches are within normal limits. IMPRESSION: 1. The left vertebral artery appears normal by MRA. But note that CTA is more sensitive for dissection. 2. A subtle defect at the ventral vertebrobasilar junction is  re-demonstrated by MRA, although seems more likely to be a small fenestration or perhaps vascular web than a thrombus or other vessel pathology. 3. Otherwise negative MRA head and neck; tortuous ICAs and Basilar artery. Electronically Signed   By: Genevie Ann M.D.   On: 05/09/2021 10:19   MR BRAIN WO CONTRAST  Result Date: 05/09/2021 CLINICAL DATA:  Initial evaluation for neuro deficit, stroke suspected. EXAM: MRI HEAD WITHOUT CONTRAST TECHNIQUE: Multiplanar, multiecho pulse sequences of the brain and surrounding structures were obtained without intravenous contrast. COMPARISON:  CT from 05/08/2021. FINDINGS: Brain: Cerebral volume within normal limits for age. Scattered subcentimeter foci of T2/FLAIR hyperintensity noted involving the periventricular and deep white matter both cerebral hemispheres, nonspecific, but most like related chronic microvascular ischemic disease, mild in nature. No abnormal foci of restricted diffusion to suggest acute or subacute ischemia. Gray-white matter differentiation maintained. No encephalomalacia to suggest chronic cortical infarction. No acute intracranial hemorrhage. Single punctate focus of susceptibility artifact noted at the left medulla (series 7, image 18), nonspecific, possibly a small chronic microhemorrhage, of doubtful significance in isolation. No mass lesion, midline shift or mass effect. No hydrocephalus or extra-axial fluid collection. Pituitary gland suprasellar region within normal limits. Midline structures intact and normal. Vascular: Major intracranial vascular flow voids are maintained. Skull and upper cervical spine: Craniocervical junction within normal limits. Bone marrow signal intensity normal. No scalp soft tissue abnormality. Sinuses/Orbits: Globes orbital soft tissues demonstrate no acute finding. Mild mucosal thickening noted within the left maxillary sinus. Paranasal sinuses are otherwise clear. No mastoid effusion. Inner ear structures grossly  normal. Other: None. IMPRESSION: 1. No acute intracranial abnormality. 2. Mild cerebral white matter disease, nonspecific, but most likely related to chronic microvascular ischemic disease. Electronically Signed   By: Jeannine Boga M.D.   On: 05/09/2021 01:22   ECHOCARDIOGRAM COMPLETE  Result Date: 05/09/2021    ECHOCARDIOGRAM REPORT   Patient Name:   PEGGY MONK Date of Exam: 05/09/2021 Medical Rec #:  [id number]     Height:       65.0 in Accession #:    [id number]    Weight:       170.0 lb Date of Birth:  [birth year]     BSA:          1.846 m Patient Age:    86 years      BP:           148/91 mmHg Patient Gender: M             HR:           84 bpm. Exam Location:  Inpatient Procedure: 2D Echo Indications:    TIA  History:        Patient has no prior history of Echocardiogram examinations.  Sonographer:    Johny Chess Referring Phys: [id number] [place]  1. Left ventricular ejection fraction, by estimation, is 65 to 70%. The left ventricle has normal function. The left ventricle has no regional wall motion abnormalities. Left ventricular diastolic parameters are consistent with Grade I diastolic dysfunction (impaired relaxation).  2. Right ventricular systolic function is normal. The right ventricular size is normal. Tricuspid regurgitation signal is inadequate for assessing PA  pressure.  3. The mitral valve is normal in structure. No evidence of mitral valve regurgitation. No evidence of mitral stenosis.  4. The aortic valve is tricuspid. Aortic valve regurgitation is not visualized. No aortic stenosis is present.  5. The inferior vena cava is normal in size with greater than 50% respiratory variability, suggesting right atrial pressure of 3 mmHg. Comparison(s): No prior Echocardiogram. FINDINGS  Left Ventricle: Left ventricular ejection fraction, by estimation, is 65 to 70%. The left ventricle has normal function. The left ventricle has no regional wall motion abnormalities. The  left ventricular internal cavity size was normal in size. There is  no left ventricular hypertrophy. Left ventricular diastolic parameters are consistent with Grade I diastolic dysfunction (impaired relaxation). Right Ventricle: The right ventricular size is normal. Right vetricular wall thickness was not well visualized. Right ventricular systolic function is normal. Tricuspid regurgitation signal is inadequate for assessing PA pressure. Left Atrium: Left atrial size was normal in size. Right Atrium: Right atrial size was normal in size. Pericardium: There is no evidence of pericardial effusion. Mitral Valve: The mitral valve is normal in structure. No evidence of mitral valve regurgitation. No evidence of mitral valve stenosis. Tricuspid Valve: The tricuspid valve is normal in structure. Tricuspid valve regurgitation is not demonstrated. No evidence of tricuspid stenosis. Aortic Valve: The aortic valve is tricuspid. Aortic valve regurgitation is not visualized. No aortic stenosis is present. Pulmonic Valve: The pulmonic valve was grossly normal. Pulmonic valve regurgitation is not visualized. No evidence of pulmonic stenosis. Aorta: The aortic root is normal in size and structure. Venous: The inferior vena cava is normal in size with greater than 50% respiratory variability, suggesting right atrial pressure of 3 mmHg. IAS/Shunts: The atrial septum is grossly normal.  LEFT VENTRICLE PLAX 2D LVIDd:         4.20 cm  Diastology LVIDs:         2.70 cm  LV e' medial:    6.85 cm/s LV PW:         0.90 cm  LV E/e' medial:  8.5 LV IVS:        0.80 cm  LV e' lateral:   8.92 cm/s LVOT diam:     1.80 cm  LV E/e' lateral: 6.5 LV SV:         41 LV SV Index:   22 LVOT Area:     2.54 cm  RIGHT VENTRICLE             IVC RV S prime:     15.80 cm/s  IVC diam: 1.60 cm TAPSE (M-mode): 2.5 cm LEFT ATRIUM             Index       RIGHT ATRIUM           Index LA diam:        3.70 cm 2.00 cm/m  RA Area:     13.50 cm LA Vol (A2C):   34.6  ml 18.74 ml/m RA Volume:   31.00 ml  16.79 ml/m LA Vol (A4C):   37.5 ml 20.31 ml/m LA Biplane Vol: 36.7 ml 19.88 ml/m  AORTIC VALVE LVOT Vmax:   109.00 cm/s LVOT Vmean:  72.800 cm/s LVOT VTI:    0.162 m  AORTA Ao Root diam: 2.80 cm Ao Asc diam:  3.10 cm MITRAL VALVE MV Area (PHT): 4.10 cm    SHUNTS MV Decel Time: 185 msec    Systemic VTI:  0.16 m MV E velocity: 58.30  cm/s  Systemic Diam: 1.80 cm MV A velocity: 72.00 cm/s MV E/A ratio:  0.81 Rudean Haskell MD Electronically signed by Rudean Haskell MD Signature Date/Time: 05/09/2021/1:19:40 PM    Final     Microbiology: Recent Results (from the past 240 hour(s))  Resp Panel by RT-PCR (Flu A&B, Covid) Nasopharyngeal Swab     Status: None   Collection Time: 05/09/21  2:32 AM   Specimen: Nasopharyngeal Swab; Nasopharyngeal(NP) swabs in vial transport medium  Result Value Ref Range Status   SARS Coronavirus 2 by RT PCR NEGATIVE NEGATIVE Final    Comment: (NOTE) SARS-CoV-2 target nucleic acids are NOT DETECTED.  The SARS-CoV-2 RNA is generally detectable in upper respiratory specimens during the acute phase of infection. The lowest concentration of SARS-CoV-2 viral copies this assay can detect is 138 copies/mL. A negative result does not preclude SARS-Cov-2 infection and should not be used as the sole basis for treatment or other patient management decisions. A negative result may occur with  improper specimen collection/handling, submission of specimen other than nasopharyngeal swab, presence of viral mutation(s) within the areas targeted by this assay, and inadequate number of viral copies(<138 copies/mL). A negative result must be combined with clinical observations, patient history, and epidemiological information. The expected result is Negative.  Fact Sheet for Patients:  EntrepreneurPulse.com.au  Fact Sheet for Healthcare Providers:  IncredibleEmployment.be  This test is no t yet  approved or cleared by the [place] FDA and  has been authorized for detection and/or diagnosis of SARS-CoV-2 by FDA under an Emergency Use Authorization (EUA). This EUA will remain  in effect (meaning this test can be used) for the duration of the COVID-19 declaration under Section 564(b)(1) of the Act, 21 U.S.C.section 360bbb-3(b)(1), unless the authorization is terminated  or revoked sooner.       Influenza A by PCR NEGATIVE NEGATIVE Final   Influenza B by PCR NEGATIVE NEGATIVE Final    Comment: (NOTE) The Xpert Xpress SARS-CoV-2/FLU/RSV plus assay is intended as an aid in the diagnosis of influenza from Nasopharyngeal swab specimens and should not be used as a sole basis for treatment. Nasal washings and aspirates are unacceptable for Xpert Xpress SARS-CoV-2/FLU/RSV testing.  Fact Sheet for Patients: EntrepreneurPulse.com.au  Fact Sheet for Healthcare Providers: IncredibleEmployment.be  This test is not yet approved or cleared by the [place] FDA and has been authorized for detection and/or diagnosis of SARS-CoV-2 by FDA under an Emergency Use Authorization (EUA). This EUA will remain in effect (meaning this test can be used) for the duration of the COVID-19 declaration under Section 564(b)(1) of the Act, 21 U.S.C. section 360bbb-3(b)(1), unless the authorization is terminated or revoked.  Performed at [facility], [place] [address], [place], [place] [address]   Blood culture (routine x 2)     Status: None (Preliminary result)   Collection Time: 05/09/21  5:15 AM   Specimen: BLOOD  Result Value Ref Range Status   Specimen Description BLOOD LEFT ARM  Final   Special Requests   Final    BOTTLES DRAWN AEROBIC AND ANAEROBIC Blood Culture adequate volume   Culture   Final    NO GROWTH 1 DAY Performed at [facility], [place] [address] [place], [place] [address]    Report Status PENDING  Incomplete  Blood culture  (routine x 2)     Status: None (Preliminary result)   Collection Time: 05/09/21  5:30 AM   Specimen: BLOOD  Result Value Ref Range Status  Specimen Description BLOOD SITE NOT SPECIFIED  Final   Special Requests   Final    BOTTLES DRAWN AEROBIC AND ANAEROBIC Blood Culture results may not be optimal due to an excessive volume of blood received in culture bottles   Culture  Setup Time   Final    GRAM POSITIVE COCCI IN CLUSTERS ANAEROBIC BOTTLE ONLY Organism ID to follow CRITICAL RESULT CALLED TO, READ BACK BY AND VERIFIED WITHSalli Real, AT 8675 05/10/21 Rush Landmark Performed at [facility], [place] [address], [place], [place] [address]    Culture GRAM POSITIVE COCCI  Final   Report Status PENDING  Incomplete  Blood Culture ID Panel (Reflexed)     Status: Abnormal   Collection Time: 05/09/21  5:30 AM  Result Value Ref Range Status   Enterococcus faecalis NOT DETECTED NOT DETECTED Final   Enterococcus Faecium NOT DETECTED NOT DETECTED Final   Listeria monocytogenes NOT DETECTED NOT DETECTED Final   Staphylococcus species DETECTED (A) NOT DETECTED Final    Comment: CRITICAL RESULT CALLED TO, READ BACK BY AND VERIFIED WITH: Denton Brick PHARMD, AT 0312 05/10/21 D. VANHOOK    Staphylococcus aureus (BCID) NOT DETECTED NOT DETECTED Final   Staphylococcus epidermidis DETECTED (A) NOT DETECTED Final    Comment: Methicillin (oxacillin) resistant coagulase negative staphylococcus. Possible blood culture contaminant (unless isolated from more than one blood culture draw or clinical case suggests pathogenicity). No antibiotic treatment is indicated for blood  culture contaminants.    Staphylococcus lugdunensis NOT DETECTED NOT DETECTED Final   Streptococcus species NOT DETECTED NOT DETECTED Final   Streptococcus agalactiae NOT DETECTED NOT DETECTED Final   Streptococcus pneumoniae NOT DETECTED NOT DETECTED Final   Streptococcus pyogenes NOT DETECTED NOT DETECTED Final    A.calcoaceticus-baumannii NOT DETECTED NOT DETECTED Final   Bacteroides fragilis NOT DETECTED NOT DETECTED Final   Enterobacterales NOT DETECTED NOT DETECTED Final   Enterobacter cloacae complex NOT DETECTED NOT DETECTED Final   Escherichia coli NOT DETECTED NOT DETECTED Final   Klebsiella aerogenes NOT DETECTED NOT DETECTED Final   Klebsiella oxytoca NOT DETECTED NOT DETECTED Final   Klebsiella pneumoniae NOT DETECTED NOT DETECTED Final   Proteus species NOT DETECTED NOT DETECTED Final   Salmonella species NOT DETECTED NOT DETECTED Final   Serratia marcescens NOT DETECTED NOT DETECTED Final   Haemophilus influenzae NOT DETECTED NOT DETECTED Final   Neisseria meningitidis NOT DETECTED NOT DETECTED Final   Pseudomonas aeruginosa NOT DETECTED NOT DETECTED Final   Stenotrophomonas maltophilia NOT DETECTED NOT DETECTED Final   Candida albicans NOT DETECTED NOT DETECTED Final   Candida auris NOT DETECTED NOT DETECTED Final   Candida glabrata NOT DETECTED NOT DETECTED Final   Candida krusei NOT DETECTED NOT DETECTED Final   Candida parapsilosis NOT DETECTED NOT DETECTED Final   Candida tropicalis NOT DETECTED NOT DETECTED Final   Cryptococcus neoformans/gattii NOT DETECTED NOT DETECTED Final   Methicillin resistance mecA/C DETECTED (A) NOT DETECTED Final    Comment: CRITICAL RESULT CALLED TO, READ BACK BY AND VERIFIED WITHSalli Real, AT 1007 05/10/21 Rush Landmark Performed at [facility], [address], [place], [place] [address]      Labs: CBC: Recent Labs  Lab 05/08/21 1517 05/09/21 0922 05/10/21 0126  WBC 11.3* 7.0 7.8  NEUTROABS 9.1*  --   --   HGB 15.6 15.2 13.3  HCT 46.6 44.9 40.5  MCV 89.8 88.9 89.8  PLT 261 234 588   Basic Metabolic Panel: Recent  Labs  Lab 05/08/21 1517 05/09/21 0922  NA 137 136  K 3.8 4.1  CL 102 103  CO2 25 25  GLUCOSE 98 114*  BUN 16 10  CREATININE 1.05 1.09  CALCIUM 9.6 9.3   Liver Function Tests: Recent Labs  Lab  05/08/21 1517  AST 29  ALT 44  ALKPHOS 58  BILITOT 0.3  PROT 7.6  ALBUMIN 4.4   CBG: No results for input(s): GLUCAP in the last 168 hours.  Time spent: 35 minutes  Signed:  Berle Mull  [facility]  05/10/2021

## 2021-05-10 NOTE — Progress Notes (Signed)
ANTICOAGULATION CONSULT NOTE Pharmacy Consult for Heparin Indication: Vertebral artery thrombosis/dissection, basilar artery thrombosis  Allergies  Allergen Reactions   Sulfa Antibiotics     Made flu symptoms worse    Patient Measurements: Height: 5' 5"  (165.1 cm) Weight: 74.9 kg (165 lb 2 oz) IBW/kg (Calculated) : 61.5   Vital Signs: Temp: 98 F (36.7 C) (07/31 0757) Temp Source: Oral (07/31 0757) BP: 131/91 (07/31 0757) Pulse Rate: 83 (07/31 0757)  Labs: Recent Labs    05/08/21 1517 05/09/21 0922 05/09/21 1409 05/09/21 2226 05/10/21 0126  HGB 15.6 15.2  --   --  13.3  HCT 46.6 44.9  --   --  40.5  PLT 261 234  --   --  207  HEPARINUNFRC  --   --  0.18* 0.33 0.33  CREATININE 1.05 1.09  --   --   --      Estimated Creatinine Clearance: 78.4 mL/min (by C-G formula based on SCr of 1.09 mg/dL).  Assessment: 50 y/o male presented to [facility] ED with dizziness and blurred vision. 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  Pharmacy was consulted for heparin dosing. Patient complaining of worsening headaches 7/31, repeat CT showed no acute changes. Headache relieved with APAP.   Heparin currently running at 1,100 units/h with therapeutic heparin level @ 0.33. Slight decrease in Hgb from 15.2>13.3. Slight decrease in PLT 234>207. No signs or symptoms of bleeding per RN.   Goal of Therapy:  Heparin level 0.3-0.5 units/ml Monitor platelets by anticoagulation protocol: Yes   Plan:  Continue Heparin infusion at 1,100 units/h  Daily heparin level and CBC  Monitor for signs and symptoms of bleeding    Adria Dill, PharmD PGY-1 Acute Care Resident  05/10/2021 9:18 AM

## 2021-05-12 LAB — CULTURE, BLOOD (ROUTINE X 2)

## 2021-05-14 LAB — CULTURE, BLOOD (ROUTINE X 2)
Culture: NO GROWTH
Special Requests: ADEQUATE

## 2022-03-24 ENCOUNTER — Encounter (HOSPITAL_BASED_OUTPATIENT_CLINIC_OR_DEPARTMENT_OTHER): Payer: Self-pay | Admitting: Obstetrics and Gynecology

## 2022-03-24 ENCOUNTER — Emergency Department (HOSPITAL_BASED_OUTPATIENT_CLINIC_OR_DEPARTMENT_OTHER)
Admission: EM | Admit: 2022-03-24 | Discharge: 2022-03-24 | Disposition: A | Discharge status: Home / Self Care | Payer: BC Managed Care – PPO | Attending: Emergency Medicine | Admitting: Emergency Medicine

## 2022-03-24 ENCOUNTER — Other Ambulatory Visit: Payer: Self-pay

## 2022-03-24 ENCOUNTER — Emergency Department (HOSPITAL_COMMUNITY): Payer: BC Managed Care – PPO

## 2022-03-24 ENCOUNTER — Emergency Department (HOSPITAL_BASED_OUTPATIENT_CLINIC_OR_DEPARTMENT_OTHER): Payer: BC Managed Care – PPO

## 2022-03-24 DIAGNOSIS — R5383 Other fatigue: Secondary | ICD-10-CM | POA: Insufficient documentation

## 2022-03-24 DIAGNOSIS — Z7901 Long term (current) use of anticoagulants: Secondary | ICD-10-CM | POA: Insufficient documentation

## 2022-03-24 DIAGNOSIS — R519 Headache, unspecified: Secondary | ICD-10-CM

## 2022-03-24 DIAGNOSIS — R42 Dizziness and giddiness: Secondary | ICD-10-CM | POA: Diagnosis present

## 2022-03-24 HISTORY — DX: Thrombosis due to vascular prosthetic devices, implants and grafts, initial encounter: T82.868A

## 2022-03-24 HISTORY — DX: Sepsis, unspecified organism: A41.9

## 2022-03-24 LAB — URINALYSIS, ROUTINE W REFLEX MICROSCOPIC
Bilirubin Urine: NEGATIVE
Glucose, UA: NEGATIVE mg/dL
Hgb urine dipstick: NEGATIVE
Ketones, ur: NEGATIVE mg/dL
Leukocytes,Ua: NEGATIVE
Nitrite: NEGATIVE
Protein, ur: NEGATIVE mg/dL
Specific Gravity, Urine: <1.005 — ABNORMAL LOW (ref 1.005–1.030)
pH: 6.5 (ref 5.0–8.0)

## 2022-03-24 LAB — BASIC METABOLIC PANEL
Anion gap: 12 (ref 5–15)
BUN: 15 mg/dL (ref 6–20)
CO2: 26 mmol/L (ref 22–32)
Calcium: 9.9 mg/dL (ref 8.9–10.3)
Chloride: 103 mmol/L (ref 98–111)
Creatinine, Ser: 0.99 mg/dL (ref 0.61–1.24)
GFR, Estimated: >60 mL/min (ref >60)
Glucose, Bld: 104 mg/dL — ABNORMAL HIGH (ref 70–99)
Potassium: 4.2 mmol/L (ref 3.5–5.1)
Sodium: 141 mmol/L (ref 135–145)

## 2022-03-24 LAB — CBC
HCT: 47.5 % (ref 39.0–52.0)
Hemoglobin: 15.8 g/dL (ref 13.0–17.0)
MCH: 29.5 pg (ref 26.0–34.0)
MCHC: 33.3 g/dL (ref 30.0–36.0)
MCV: 88.8 fL (ref 80.0–100.0)
Platelets: 241 10*3/uL (ref 150–400)
RBC: 5.35 MIL/uL (ref 4.22–5.81)
RDW: 13.2 % (ref 11.5–15.5)
WBC: 6.9 10*3/uL (ref 4.0–10.5)
nRBC: 0 % (ref 0.0–0.2)

## 2022-03-24 MED ORDER — MECLIZINE HCL 25 MG PO TABS
25.0000 mg | ORAL_TABLET | Freq: Once | ORAL | Status: AC
  Administered 2022-03-24: 25 mg via ORAL
  Filled 2022-03-24: qty 1

## 2022-03-24 MED ORDER — DIAZEPAM 5 MG PO TABS: 5.0000 mg | ORAL_TABLET | Freq: Two times a day (BID) | ORAL | 0 refills | Status: AC

## 2022-03-24 MED ORDER — SODIUM CHLORIDE 0.9 % IV SOLN: INTRAVENOUS | Status: DC

## 2022-03-24 MED ORDER — IOHEXOL 350 MG/ML SOLN
100.0000 mL | Freq: Once | INTRAVENOUS | Status: AC | PRN
Start: 2022-03-24 — End: 2022-03-24
  Administered 2022-03-24: 75 mL via INTRAVENOUS

## 2022-03-24 MED ORDER — SODIUM CHLORIDE 0.9 % IV BOLUS
1000.0000 mL | Freq: Once | INTRAVENOUS | Status: AC
  Administered 2022-03-24: 1000 mL via INTRAVENOUS

## 2022-03-24 NOTE — Progress Notes (Signed)
History as provided by Dr. Rogene Houston, who notes that the patient's examination was overall benign although the patient did seem somewhat dizzy.   "Patient presenting with 3-week history of some dizziness feeling a little bit disoriented and a little bit lightheaded no true room spinning.  Forehead pressure, fatigue.  Patient states that the headaches been worse in the last 2 days.  But not severe. Patient's past medical history sniffing that in June 2022 status post a TURP procedure he developed sepsis.  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  He was anticoagulated until December 2022.  Patient is concerned that something similar has happened. Patient recently moved from [place].  His your neurologist in [place] was contacted and recommended that he get a CT scan of the head.  Patient does not have a primary care provider locally.  Past medical history as stated for the sepsis and had temporary dialysis any thrombosis of the vascular access."  Otherwise patient's labs and vitals are reassuring  Imaging below personally reviewed, please see radiology reports:  CTA May 09, 2021  1. Subtle filling defect at the origin of the left vertebral artery, nonspecific, but suspicious for intraluminal thrombus, possibly related to a small focal dissection. No significant stenosis. 2. Additional subtle 2 mm filling defect within the basilar artery just distal to the vertebrobasilar junction, also suspicious for a small focus of thrombus, suspected to be embolic in nature from the left vertebral artery below. Findings could be confirmed with catheter directed arteriogram as clinically warranted. 3. Otherwise negative CTA of the head and neck. No large vessel occlusion. No other hemodynamically significant or correctable stenosis.  CTA Mar 24, 2022 No acute abnormality. No convincing filling defect at the  left vertebral artery origin. There is greater streak artifact in this area on this study. Subtle filling defect within the ventral basilar artery just distal to the vertebrobasilar junction is again identified. Possible that this reflects sequelae of prior thrombus or a small web.  Scans also discussed with Dr. Karenann Cai to inquire if catheter directed angiogram may be helpful to clarify the nature of the filling defect.  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  Generally this is felt to be a benign finding requiring no treatment, though there are some case reports that argue it can be a rare cause of stroke.  In the absence of high-quality data supporting anticoagulation for fenestration, I would not empirically start anticoagulation at this time.  I do however recommend MRI brain to confirm that there is no acute intracranial process explaining the patient's symptoms.  Should this be negative I recommend outpatient follow-up for his dizziness, with vestibular rehabilitation.  Should MRI brain be positive, please place a formal neurology consult  Discussed with Dr. Rogene Houston via phone and secure chat Jim Noe MD-PhD [facility] [phone]

## 2022-03-24 NOTE — ED Provider Notes (Signed)
Pt was transferred here from [place] for a MRI due to dizziness.  MRI:  IMPRESSION:  No evidence of acute intracranial abnormality.    Mild multifocal T2 FLAIR hyperintense signal abnormality within the  cerebral white matter, nonspecific but most often secondary to  chronic small vessel ischemia. These signal changes are similar to  the prior brain MRI of 05/09/2021.    Mild-to-moderate mucosal thickening within the left maxillary sinus.    Films reviewed and I agree with the radiologist.  Pt still feels intermittently dizzy.  He looks well and is sitting up and talking.  He is stable for d/c.  He is to f/u with ENT.  He is to return if worse.   Isla Pence, MD 03/24/22 Pauline Aus

## 2022-03-24 NOTE — ED Provider Notes (Addendum)
[facility] EMERGENCY DEPT Provider Note   CSN: [id number] Arrival date & time: 03/24/22  7939     History  Chief Complaint  Patient presents with   Dizziness    Jim Roman is a 51 y.o. male.  Patient presenting with 3-week history of some dizziness feeling a little bit disoriented and a little bit lightheaded no true room spinning.  Forehead pressure, fatigue.  Patient states that the headaches been worse in the last 2 days.  But not severe.  Patient's past medical history sniffing that in June 2022 status post a TURP procedure he developed sepsis.  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  He was anticoagulated until December 2022.  Patient is concerned that something similar has happened.  Patient recently moved from [place].  His your neurologist in [place] was contacted and recommended that he get a CT scan of the head.  Patient does not have a primary care provider locally.  Past medical history as stated for the sepsis and had temporary dialysis any thrombosis of the vascular access.       Home Medications Prior to Admission medications   Medication Sig Start Date End Date Taking? Authorizing Provider  acetaminophen (TYLENOL) 500 MG tablet Take 500 mg by mouth every 6 (six) hours as needed for mild pain or headache.    [provider]  atorvastatin (LIPITOR) 80 MG tablet Take 1 tablet (80 mg total) by mouth daily. 05/11/21   Lavina Hamman, MD  Naphazoline-Glycerin (CLEAR EYES MAX REDNESS RELIEF OP) Apply 1 drop to eye daily as needed (irritated eyes).    [provider]  Probiotic Product (PROBIOTIC PO) Take 1 capsule by mouth daily.    [provider]  rivaroxaban (XARELTO) 20 MG TABS tablet Take 1 tablet (20 mg total) by mouth daily. 05/11/21   Lavina Hamman, MD      Allergies    Statins and Sulfa antibiotics    Review of Systems    Review of Systems  Constitutional:  Negative for chills and fever.  HENT:  Negative for ear pain and sore throat.   Eyes:  Negative for pain and visual disturbance.  Respiratory:  Negative for cough and shortness of breath.   Cardiovascular:  Negative for chest pain and palpitations.  Gastrointestinal:  Negative for abdominal pain and vomiting.  Genitourinary:  Negative for dysuria and hematuria.  Musculoskeletal:  Negative for arthralgias and back pain.  Skin:  Negative for color change and rash.  Neurological:  Positive for dizziness, light-headedness and headaches. Negative for seizures, syncope, speech difficulty, weakness and numbness.  All other systems reviewed and are negative.   Physical Exam Updated Vital Signs BP 119/80   Pulse 82   Temp 98 F (36.7 C) (Oral)   Resp 13   Ht 1.651 m (5' 5" )   Wt 74.4 kg   SpO2 97%   BMI 27.29 kg/m  Physical Exam Vitals and nursing note reviewed.  Constitutional:      General: He is not in acute distress.    Appearance: Normal appearance. He is well-developed.  HENT:     Head: Normocephalic and atraumatic.     Mouth/Throat:     Mouth: Mucous membranes are moist.  Eyes:     Extraocular Movements: Extraocular movements intact.     Conjunctiva/sclera: Conjunctivae normal.     Pupils: Pupils are equal, round, and reactive to light.  Cardiovascular:  Rate and Rhythm: Normal rate and regular rhythm.     Heart sounds: No murmur heard. Pulmonary:     Effort: Pulmonary effort is normal. No respiratory distress.     Breath sounds: Normal breath sounds.  Abdominal:     Palpations: Abdomen is soft.     Tenderness: There is no abdominal tenderness.  Musculoskeletal:        General: No swelling.     Cervical back: Normal range of motion and neck supple.  Skin:    General: Skin is warm and dry.     Capillary Refill: Capillary refill takes less than 2 seconds.  Neurological:     General: No focal deficit present.     Mental  Status: He is alert and oriented to person, place, and time.     Cranial Nerves: No cranial nerve deficit.     Sensory: No sensory deficit.     Motor: No weakness.     Comments: No nystagmus.  Able to elicit a little bit of dizziness with extraocular muscle exam.  And a little bit of dizziness with movement of the head left and right but no true vertigo.  Psychiatric:        Mood and Affect: Mood normal.     ED Results / Procedures / Treatments   Labs (all labs ordered are listed, but only abnormal results are displayed) Labs Reviewed  BASIC METABOLIC PANEL - Abnormal; Notable for the following components:      Result Value   Glucose, Bld 104 (*)    All other components within normal limits  URINALYSIS, ROUTINE W REFLEX MICROSCOPIC - Abnormal; Notable for the following components:   Color, Urine COLORLESS (*)    Specific Gravity, Urine <1.005 (*)    All other components within normal limits  CBC    EKG EKG Interpretation  Date/Time:  Wednesday March 24 2022 09:43:10 EDT Ventricular Rate:  88 PR Interval:  168 QRS Duration: 87 QT Interval:  340 QTC Calculation: 412 R Axis:   32 Text Interpretation: Sinus rhythm Confirmed by Fredia Sorrow [phone]) on 03/24/2022 11:03:39 AM  Radiology No results found.  Procedures Procedures    Medications Ordered in ED Medications  sodium chloride 0.9 % bolus 1,000 mL (has no administration in time range)  0.9 %  sodium chloride infusion (has no administration in time range)  meclizine (ANTIVERT) tablet 25 mg (has no administration in time range)    ED Course/ Medical Decision Making/ A&P                           Medical Decision Making Amount and/or Complexity of Data Reviewed Labs: ordered. Radiology: ordered.  Risk Prescription drug management.   Patient patient metabolic panel normal renal function normal CBC normal no leukocytosis hemoglobin 15.8 urinalysis negative for urinary tract infection.  EKG without any acute  findings.  Based on patient's past medical history from 2022 we will go ahead and get CT angio head and neck.  I feel if those do not have any acute findings and patient stable for discharge home and follow-up.  CT angio with some subtle abnormalities.  Will discuss with teleneurology to see whether these are significant.  No convincing filling defect at the left vertebral artery origin and there is greater streak artifact in this area however.  And there is subtle filling defect within the ventral basilar artery just distal to the vertebrobasilar junction is again identified possible that  this reflects sequelae of the prior thrombus or a small web.  Discussed with on-call teleneurologist Dr. Curly Shores.  Who is recommending plain MRI to rule out any other cause for the dizziness symptoms like a stroke.  If no evidence of a stroke then patient could be anticoagulated and follow-up with neurology but she is kind of recommending like follow-up with neurology outpatient perhaps at a tertiary care center.  But patient could be plugged into neurology here locally.  She feels that most of the findings on the CT angio are probably residual from the original event back in the summer 2022.  We will discuss with on-call ED physician at [facility] for excepting.  This recontacted by Dr. Curly Shores she is wondering whether patient needs a true catheter angiogram.  So she is going to check with them and then get back to me.  We will put the transfer on hold for now.  Addendum: Dr. Curly Shores feels that in conversation with subspecialists that angiogram not needed.  Patient to be transferred into [facility] for MRI.  If MRI shows stroke neurology be called so he can be admitted if completely negative can be discharged home with neurology and vestibular rehab.  All outpatient.  No indication for anticoagulation to be started empirically.  Discussed with [facility] attending Dr.Zavit.  We will arrange for [facility] to transport him in for  MRI.  MRI is ordered.    Final Clinical Impression(s) / ED Diagnoses Final diagnoses:  Dizziness  Acute intractable headache, unspecified headache type    Rx / DC Orders ED Discharge Orders     None         Fredia Sorrow, MD 03/24/22 1129    Fredia Sorrow, MD 03/24/22 1347    Fredia Sorrow, MD 03/24/22 1413    Fredia Sorrow, MD 03/24/22 1438

## 2022-03-24 NOTE — ED Notes (Signed)
E-signature pad unavailable at time of pt discharge. This RN discussed discharge materials with pt and answered all pt questions. Pt stated understanding of discharge material. ? ?

## 2022-03-24 NOTE — ED Triage Notes (Signed)
Patient reports he recently moved to the area from [place] and has a apt with neurology in august but he has had worsening dizziness and headaches and his neurologist in [place] told him to go to the ER for CT scan.

## 2022-03-24 NOTE — ED Notes (Signed)
Patient transported to CT 

## 2022-09-04 ENCOUNTER — Emergency Department (HOSPITAL_BASED_OUTPATIENT_CLINIC_OR_DEPARTMENT_OTHER)
Admission: EM | Admit: 2022-09-04 | Discharge: 2022-09-04 | Disposition: A | Discharge status: Home / Self Care | Payer: BC Managed Care – PPO | Attending: Emergency Medicine | Admitting: Emergency Medicine

## 2022-09-04 ENCOUNTER — Emergency Department (HOSPITAL_BASED_OUTPATIENT_CLINIC_OR_DEPARTMENT_OTHER): Payer: BC Managed Care – PPO

## 2022-09-04 ENCOUNTER — Encounter (HOSPITAL_BASED_OUTPATIENT_CLINIC_OR_DEPARTMENT_OTHER): Payer: Self-pay

## 2022-09-04 ENCOUNTER — Emergency Department (HOSPITAL_BASED_OUTPATIENT_CLINIC_OR_DEPARTMENT_OTHER): Payer: BC Managed Care – PPO | Admitting: Radiology

## 2022-09-04 ENCOUNTER — Other Ambulatory Visit: Payer: Self-pay

## 2022-09-04 DIAGNOSIS — Z79899 Other long term (current) drug therapy: Secondary | ICD-10-CM | POA: Diagnosis not present

## 2022-09-04 DIAGNOSIS — R209 Unspecified disturbances of skin sensation: Secondary | ICD-10-CM | POA: Diagnosis not present

## 2022-09-04 DIAGNOSIS — M545 Low back pain, unspecified: Secondary | ICD-10-CM | POA: Diagnosis not present

## 2022-09-04 DIAGNOSIS — R072 Precordial pain: Secondary | ICD-10-CM | POA: Diagnosis present

## 2022-09-04 DIAGNOSIS — R079 Chest pain, unspecified: Secondary | ICD-10-CM

## 2022-09-04 LAB — TROPONIN I (HIGH SENSITIVITY)
Troponin I (High Sensitivity): 3 ng/L (ref <18)
Troponin I (High Sensitivity): 3 ng/L (ref <18)

## 2022-09-04 LAB — BASIC METABOLIC PANEL
Anion gap: 14 (ref 5–15)
BUN: 26 mg/dL — ABNORMAL HIGH (ref 6–20)
CO2: 23 mmol/L (ref 22–32)
Calcium: 9.2 mg/dL (ref 8.9–10.3)
Chloride: 102 mmol/L (ref 98–111)
Creatinine, Ser: 1 mg/dL (ref 0.61–1.24)
GFR, Estimated: >60 mL/min (ref >60)
Glucose, Bld: 128 mg/dL — ABNORMAL HIGH (ref 70–99)
Potassium: 4.5 mmol/L (ref 3.5–5.1)
Sodium: 139 mmol/L (ref 135–145)

## 2022-09-04 LAB — CBC
HCT: 45.5 % (ref 39.0–52.0)
Hemoglobin: 15.3 g/dL (ref 13.0–17.0)
MCH: 30.1 pg (ref 26.0–34.0)
MCHC: 33.6 g/dL (ref 30.0–36.0)
MCV: 89.6 fL (ref 80.0–100.0)
Platelets: 259 10*3/uL (ref 150–400)
RBC: 5.08 MIL/uL (ref 4.22–5.81)
RDW: 12.7 % (ref 11.5–15.5)
WBC: 8.1 10*3/uL (ref 4.0–10.5)
nRBC: 0 % (ref 0.0–0.2)

## 2022-09-04 MED ORDER — IOHEXOL 350 MG/ML SOLN
100.0000 mL | Freq: Once | INTRAVENOUS | Status: AC | PRN
  Administered 2022-09-04: 75 mL via INTRAVENOUS

## 2022-09-04 NOTE — Discharge Instructions (Signed)
You were seen in the emergency department for evaluation of chest and back pain and left arm tingling.  You had blood work EKG chest x-ray and a CAT scan of your chest that did not show any obvious explanation for your symptoms.  There is no evidence of any cardiac injury.  Please follow-up with your cardiologist and primary care doctor.  Return to the emergency department if any worsening or concerning symptoms.

## 2022-09-04 NOTE — ED Provider Notes (Signed)
[facility] EMERGENCY DEPT Provider Note   CSN: [id number] Arrival date & time: 09/04/22  1600     History {Add pertinent medical, surgical, social history, OB history to HPI:1} Chief Complaint  Patient presents with   Chest Pain    Jim Roman is a 51 y.o. male.  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  He said for the last few days he has been having some sharp stabbing chest pain that goes into his shoulder blades.  It was associated with some numbness and tingling in his left hand.  Not associated with shortness of breath diaphoresis dizziness nausea vomiting.  He said he walked 5 miles today without any difficulty.  He went to urgent care for evaluation of this pain and they recommended he come here for further evaluation.  He said he had a fairly significant heart work-up when he had his vascular episode and was told everything was good with his heart.  He is on blood thinners.  The history is provided by the patient.  Chest Pain Pain location:  Substernal area Pain quality: sharp and stabbing   Pain radiates to:  Mid back Pain severity:  Mild Onset quality:  Gradual Duration:  2 days Timing:  Intermittent Progression:  Unchanged Chronicity:  New Relieved by:  None tried Worsened by:  Exertion Ineffective treatments:  None tried Associated symptoms: back pain and numbness   Associated symptoms: no abdominal pain, no cough, no diaphoresis, no dizziness, no fever, no nausea, no shortness of breath and no vomiting   Risk factors: no coronary artery disease and no smoking        Home Medications Prior to Admission medications   Medication Sig Start Date End Date Taking? Authorizing Provider  losartan (COZAAR) 25 MG tablet Take 0.5 tablets by mouth daily. 02/21/22  Yes [provider]  acetaminophen (TYLENOL) 500 MG tablet Take 500 mg by mouth every 6 (six) hours as needed for mild pain or  headache.    [provider]  atorvastatin (LIPITOR) 80 MG tablet Take 1 tablet (80 mg total) by mouth daily. 05/11/21   Rolly Salter, MD  diazepam (VALIUM) 5 MG tablet Take 1 tablet (5 mg total) by mouth 2 (two) times daily. 03/24/22   Jacalyn Lefevre, MD  Naphazoline-Glycerin (CLEAR EYES MAX REDNESS RELIEF OP) Apply 1 drop to eye daily as needed (irritated eyes).    [provider]  Probiotic Product (PROBIOTIC PO) Take 1 capsule by mouth daily.    [provider]  propranolol (INDERAL) 10 MG tablet Take by mouth.    [provider]  rivaroxaban (XARELTO) 20 MG TABS tablet Take 1 tablet (20 mg total) by mouth daily. 05/11/21   Rolly Salter, MD      Allergies    Statins and Sulfa antibiotics    Review of Systems   Review of Systems  Constitutional:  Negative for diaphoresis and fever.  Eyes:  Negative for visual disturbance.  Respiratory:  Negative for cough and shortness of breath.   Cardiovascular:  Positive for chest pain.  Gastrointestinal:  Negative for abdominal pain, nausea and vomiting.  Musculoskeletal:  Positive for back pain.  Neurological:  Positive for numbness. Negative for dizziness.    Physical Exam Updated Vital Signs BP (!) 136/100 (BP Location: Right Arm)   Pulse 89   Temp 98.4 F (36.9 C) (Oral)   Resp 14   Ht 5\' 5"  (1.651 m)  Wt 74.4 kg   SpO2 100%   BMI 27.29 kg/m  Physical Exam Vitals and nursing note reviewed.  Constitutional:      General: He is not in acute distress.    Appearance: Normal appearance. He is well-developed.  HENT:     Head: Normocephalic and atraumatic.  Eyes:     Conjunctiva/sclera: Conjunctivae normal.  Cardiovascular:     Rate and Rhythm: Normal rate and regular rhythm.     Pulses:          Radial pulses are 2+ on the right side and 2+ on the left side.     Heart sounds: Normal heart sounds. No murmur heard. Pulmonary:     Effort: Pulmonary effort is normal. No respiratory distress.      Breath sounds: Normal breath sounds.  Abdominal:     Palpations: Abdomen is soft.     Tenderness: There is no abdominal tenderness.  Musculoskeletal:        General: No swelling.     Cervical back: Neck supple.     Right lower leg: No edema.     Left lower leg: No edema.  Skin:    General: Skin is warm and dry.     Capillary Refill: Capillary refill takes less than 2 seconds.  Neurological:     General: No focal deficit present.     Mental Status: He is alert.     ED Results / Procedures / Treatments   Labs (all labs ordered are listed, but only abnormal results are displayed) Labs Reviewed  BASIC METABOLIC PANEL - Abnormal; Notable for the following components:      Result Value   Glucose, Bld 128 (*)    BUN 26 (*)    All other components within normal limits  CBC  TROPONIN I (HIGH SENSITIVITY)  TROPONIN I (HIGH SENSITIVITY)    EKG EKG Interpretation  Date/Time:  Saturday September 04 2022 16:13:58 EST Ventricular Rate:  83 PR Interval:  176 QRS Duration: 74 QT Interval:  326 QTC Calculation: 383 R Axis:   68 Text Interpretation: Normal sinus rhythm with sinus arrhythmia Normal ECG When compared with ECG of 24-Mar-2022 09:43, No significant change since last tracing Confirmed by Meridee Score [phone]) on 09/04/2022 4:16:54 PM  Radiology DG Chest 2 View  Result Date: 09/04/2022 CLINICAL DATA:  Chest pain EXAM: CHEST - 2 VIEW COMPARISON:  None Available. FINDINGS: The heart size and mediastinal contours are within normal limits. Both lungs are clear. The visualized skeletal structures are unremarkable. IMPRESSION: No active cardiopulmonary disease. Electronically Signed   By: Minerva Fester M.D.   On: 09/04/2022 17:03    Procedures Procedures  {Document cardiac monitor, telemetry assessment procedure when appropriate:1}  Medications Ordered in ED Medications - No data to display  ED Course/ Medical Decision Making/ A&P                           Medical  Decision Making Amount and/or Complexity of Data Reviewed Labs: ordered. Radiology: ordered.   This patient complains of ***; this involves an extensive number of treatment Options and is a complaint that carries with it a high risk of complications and morbidity. The differential includes ***  I ordered, reviewed and interpreted labs, which included *** I ordered medication *** and reviewed PMP when indicated. I ordered imaging studies which included *** and I independently    visualized and interpreted imaging which showed *** Additional history obtained  from *** Previous records obtained and reviewed *** I consulted *** and discussed lab and imaging findings and discussed disposition.  Cardiac monitoring reviewed, *** Social determinants considered, *** Critical Interventions: ***  After the interventions stated above, I reevaluated the patient and found *** Admission and further testing considered, ***   {Document critical care time when appropriate:1} {Document review of labs and clinical decision tools ie heart score, Chads2Vasc2 etc:1}  {Document your independent review of radiology images, and any outside records:1} {Document your discussion with family members, caretakers, and with consultants:1} {Document social determinants of health affecting pt's care:1} {Document your decision making why or why not admission, treatments were needed:1} Final Clinical Impression(s) / ED Diagnoses Final diagnoses:  None    Rx / DC Orders ED Discharge Orders     None

## 2022-09-04 NOTE — ED Triage Notes (Signed)
Patient arrives ambulatory to triage with complaints of intermittent chest discomfort/sharp pain x3 days. Patient was sent here from Urgent Care for further cardiac workup.  Rates chest pain a 2/10.

## 2022-09-04 NOTE — ED Notes (Signed)
Sunday last week pain down left arm, went away on own. Tues started to have intermittent pain in chest, left side and between scapulas. Intermittent since then, not currently happening, no real cause (happens sometimes while moving and while sitting) and no fainting/lightheaded/N/V/SOB.

## 2023-12-26 IMAGING — MR MR HEAD W/O CM
9 of 10 series · 38 of 48 positions shown · non-contrast
Comparison: Non-contrast head CT and CT angiogram head/neck
03/24/2022. Brain MRI 05/09/2021.

CLINICAL DATA: Provided history: Neuro deficit, acute, stroke
suspected. Additional history provided: Dizziness, disorientation,
lightheadedness, forehead pressure.

EXAM:
MRI HEAD WITHOUT CONTRAST
TECHNIQUE: Multiplanar, multiecho pulse sequences of the brain and surrounding
structures were obtained without intravenous contrast.

[Series 3: DWI · axial · 3.0mm · 1.09mm/px · z∈[-50,+105]mm · 11 of 106 slices shown (1 of 4)]
[im 1/106]
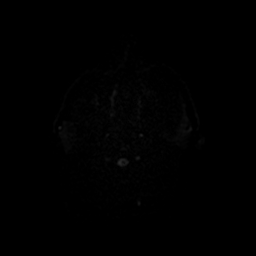
[im 11/106]
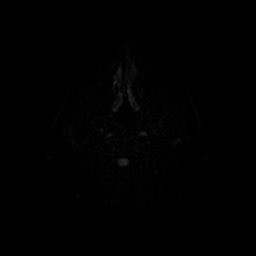
[im 22/106]
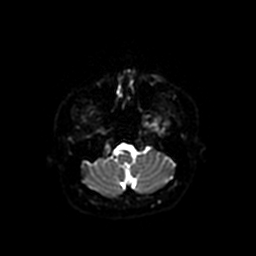
[im 32/106]
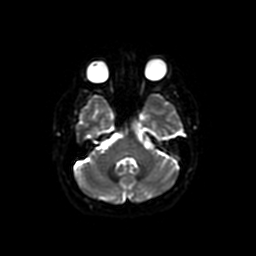
[im 43/106]
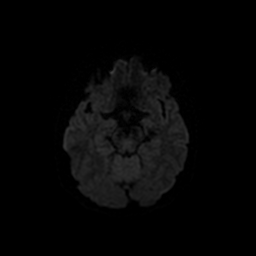
[im 53/106]
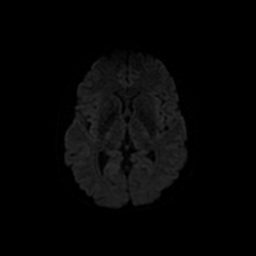
[im 64/106]
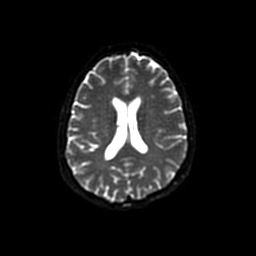
[im 74/106]
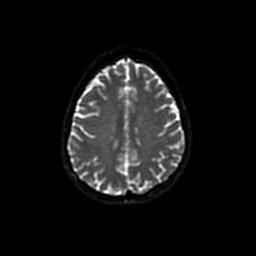
[im 85/106]
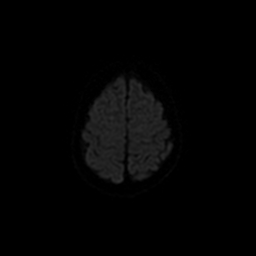
[im 95/106]
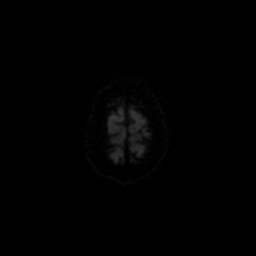
[im 106/106]
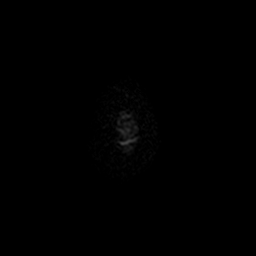

[Series 4: DWI · coronal · 5.0mm · 1.09mm/px · 8 of 76 slices shown (2 of 4)]
[im 1/76]
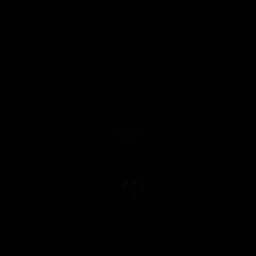
[im 11/76]
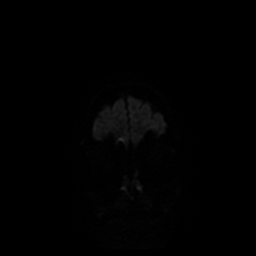
[im 22/76]
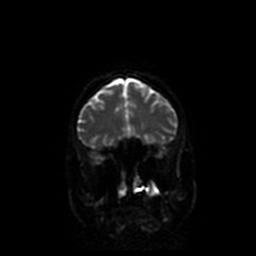
[im 33/76]
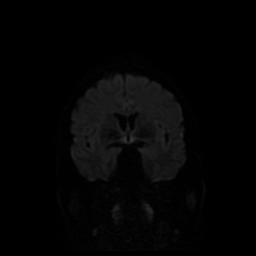
[im 43/76]
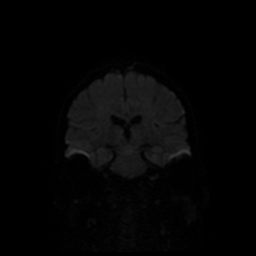
[im 54/76]
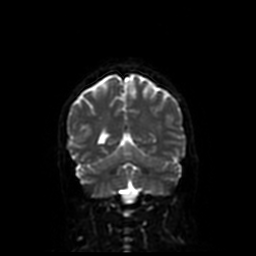
[im 65/76]
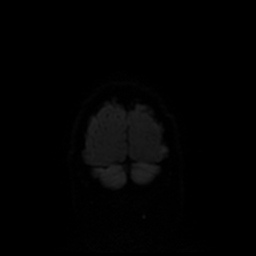
[im 76/76]
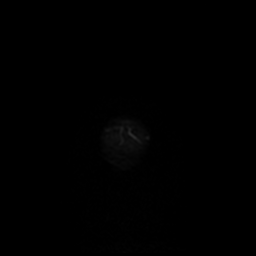

[Series 5: T1 · sagittal · 5.0mm · 0.47mm/px · 2 of 23 slices shown (1 of 2)]
[im 1/23]
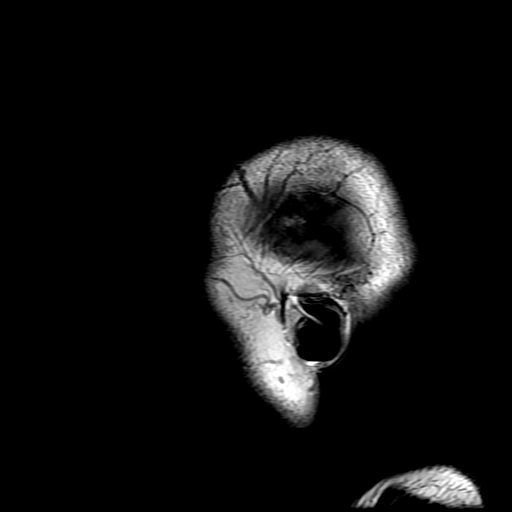
[im 23/23]
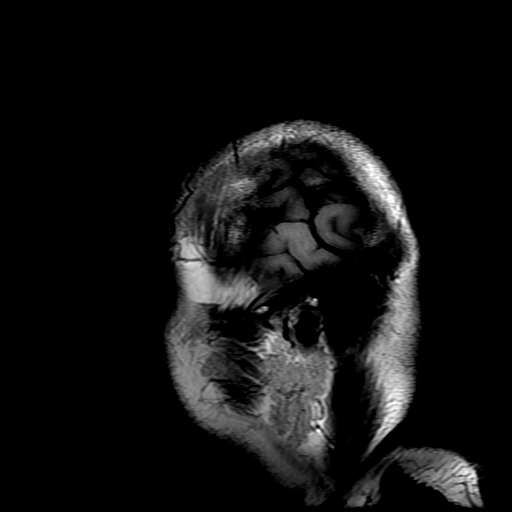

[Series 6: T2 · axial · 5.0mm · 0.45mm/px · z∈[-43,+94]mm · 2 of 24 slices shown (1 of 2)]
[im 1/24]
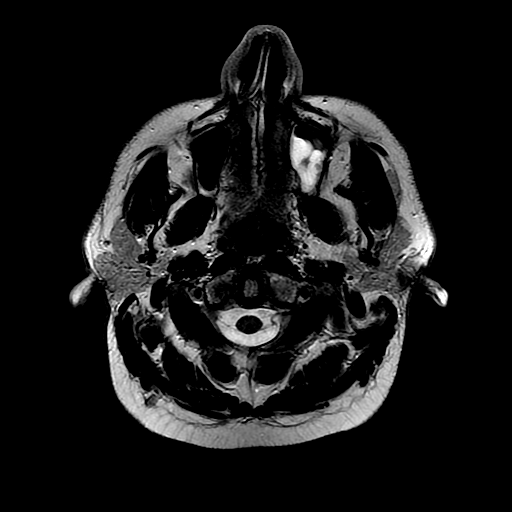
[im 24/24]
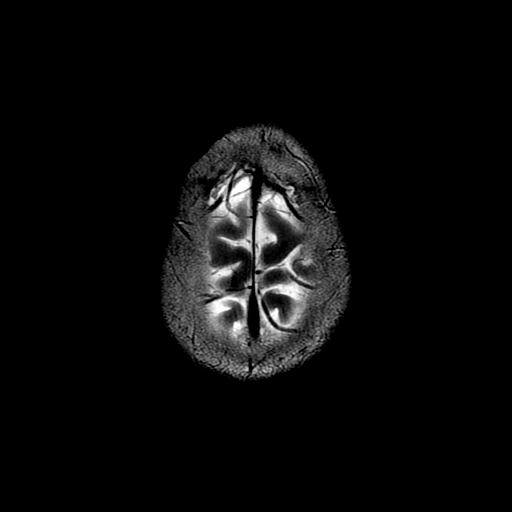

[Series 7: FLAIR · axial · 3.0mm · 0.45mm/px · z∈[-43,+94]mm · 2 of 24 slices shown]
[im 1/24]
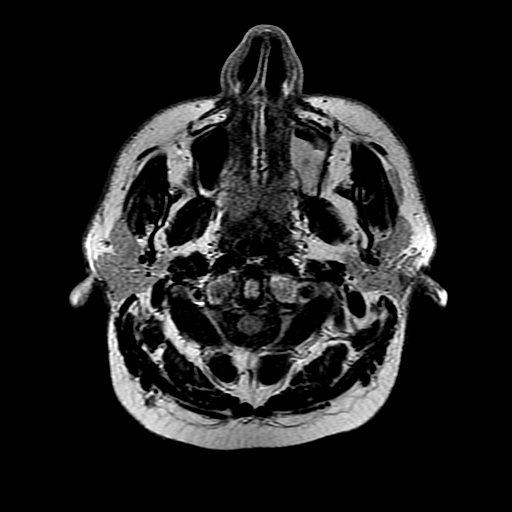
[im 24/24]
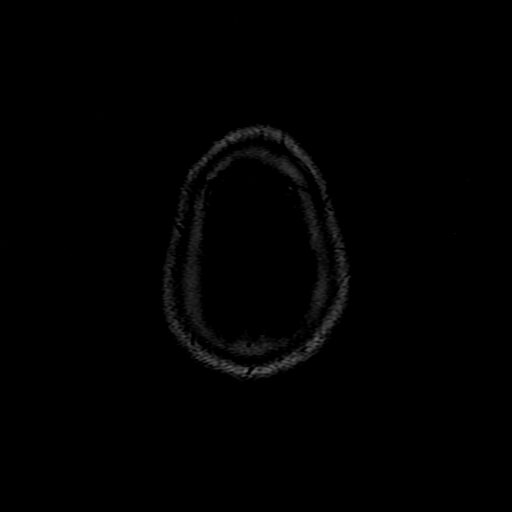

[Series 9: T1 · axial · 3.0mm · 0.45mm/px · z∈[-48,-31]mm · 2 of 100 slices shown (2 of 2)]
[im 1/100]
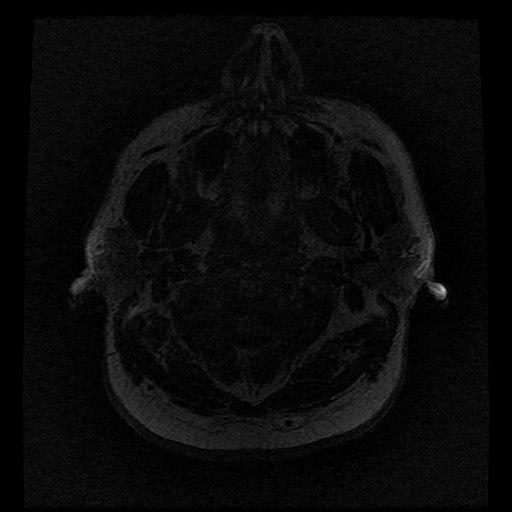
[im 12/100]
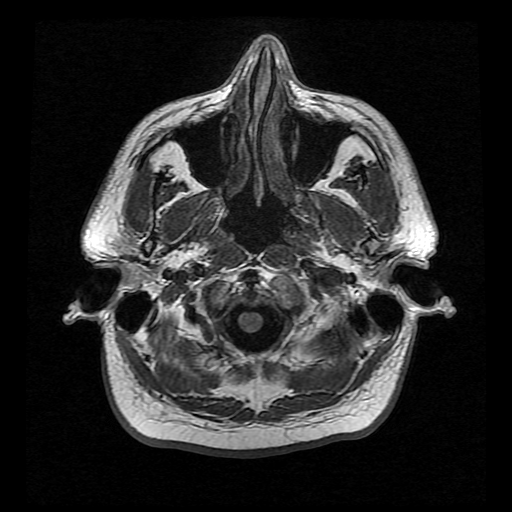

[Series 10: T2 · coronal · 5.0mm · 0.43mm/px · 2 of 24 slices shown (2 of 2)]
[im 1/24]
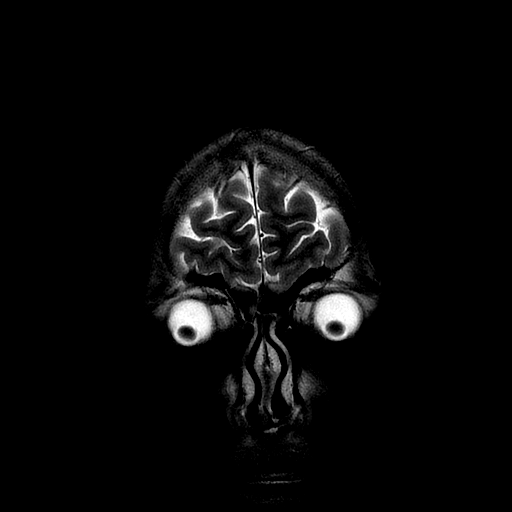
[im 24/24]
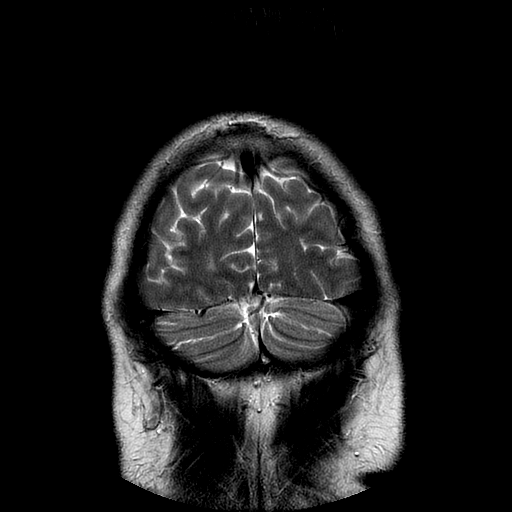

[Series 300: DWI · axial · 3.0mm · 1.09mm/px · z∈[-50,+105]mm · 5 of 53 slices shown (3 of 4)]
[im 1/53]
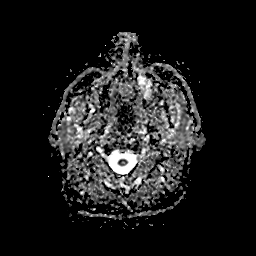
[im 14/53]
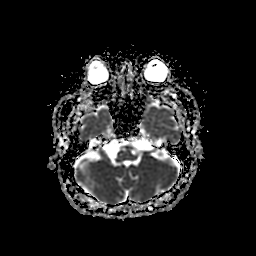
[im 27/53]
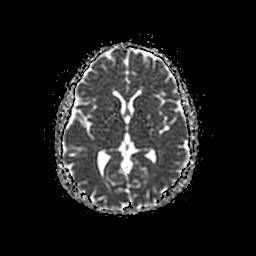
[im 40/53]
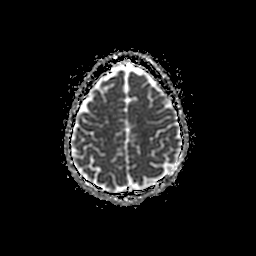
[im 53/53]
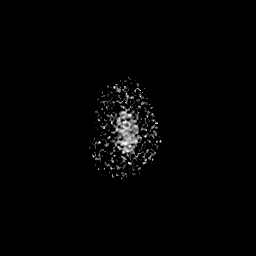

[Series 400: DWI · coronal · 5.0mm · 1.09mm/px · 4 of 38 slices shown (4 of 4)]
[im 1/38]
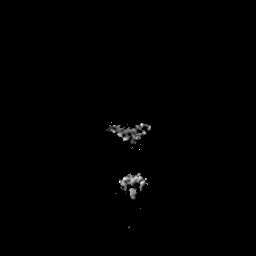
[im 13/38]
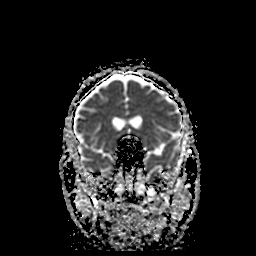
[im 25/38]
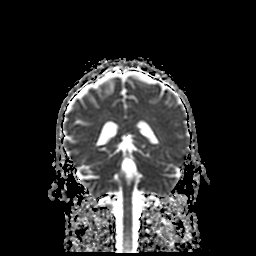
[im 38/38]
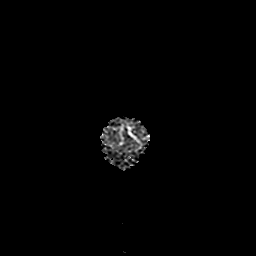

[38 of 48 positions shown; findings below may reference images not displayed]

FINDINGS: Brain:

Cerebral volume is normal.

Mild multifocal T2 FLAIR hyperintense signal abnormality within the
cerebral white matter, nonspecific but most often secondary to
chronic small vessel ischemia.

There is no acute infarct.

No evidence of an intracranial mass.

No chronic intracranial blood products.

No extra-axial fluid collection.

No midline shift.

Vascular: Maintained flow voids within the proximal large arterial
vessels.

Skull and upper cervical spine: No focal suspicious marrow lesion.

Sinuses/Orbits: No mass or acute finding within the imaged orbits.
Mild-to-moderate mucosal thickening within the inferior left
maxillary sinus.
IMPRESSION: No evidence of acute intracranial abnormality.

Mild multifocal T2 FLAIR hyperintense signal abnormality within the
cerebral white matter, nonspecific but most often secondary to
chronic small vessel ischemia. These signal changes are similar to
the prior brain MRI of 05/09/2021.

Mild-to-moderate mucosal thickening within the left maxillary sinus.
